# Patient Record
Sex: Male | Born: 1965 | Race: White | Hispanic: No | Marital: Married | Smoking: Never smoker
Health system: Southern US, Community
[De-identification: ages and names within clinical notes are randomized; demographics above are authoritative.]

## PROBLEM LIST (undated history)

## (undated) DIAGNOSIS — N200 Calculus of kidney: Secondary | ICD-10-CM

## (undated) DIAGNOSIS — N400 Enlarged prostate without lower urinary tract symptoms: Secondary | ICD-10-CM

## (undated) DIAGNOSIS — N529 Male erectile dysfunction, unspecified: Secondary | ICD-10-CM

## (undated) DIAGNOSIS — K219 Gastro-esophageal reflux disease without esophagitis: Secondary | ICD-10-CM

## (undated) HISTORY — PX: TONSILLECTOMY: SUR1361

## (undated) HISTORY — DX: Benign prostatic hyperplasia without lower urinary tract symptoms: N40.0

## (undated) HISTORY — PX: CHOLECYSTECTOMY: SHX55

## (undated) HISTORY — DX: Calculus of kidney: N20.0

## (undated) HISTORY — DX: Male erectile dysfunction, unspecified: N52.9

---

## 2008-11-28 ENCOUNTER — Emergency Department: Payer: Self-pay | Admitting: Emergency Medicine

## 2011-01-02 IMAGING — CT CT ABD-PELV W/O CM
1 of 2 series · 16 of 32 positions shown, 20 images · non-contrast
Comparison: none

REASON FOR EXAM: (1) LLQ pain; (2) LLQ pain, stone protocol
COMMENTS:

[Series 2: stone · axial · 0.69mm/px · z∈[-942,-484]mm · 16 of 167 slices shown, 20 images]
[im 7/167  soft-tissue]
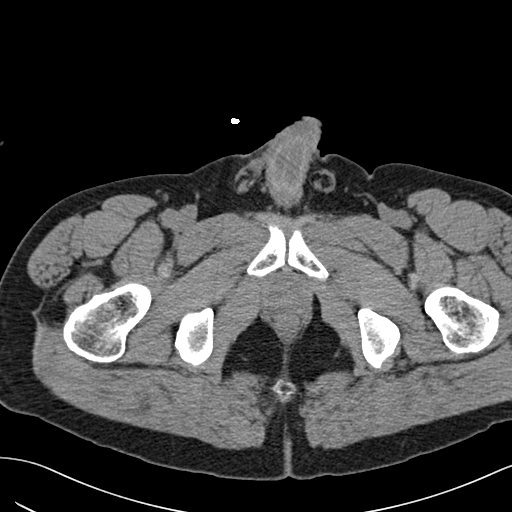
[im 7/167  bone]
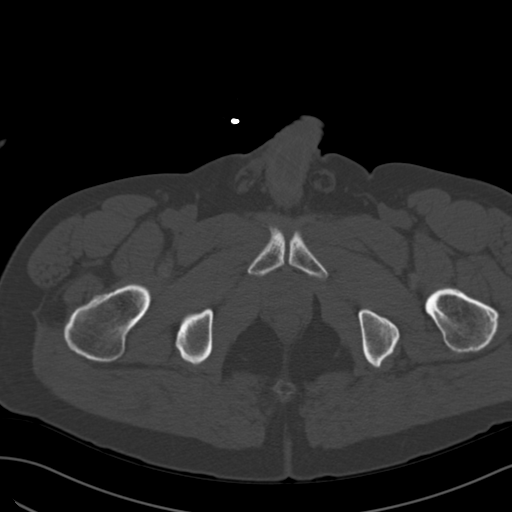
[im 19/167  soft-tissue]
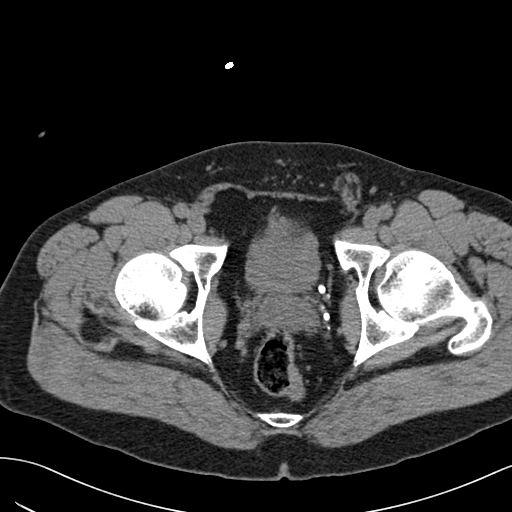
[im 31/167  soft-tissue]
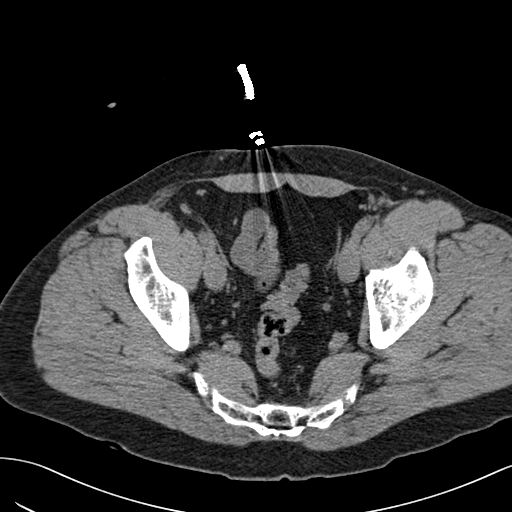
[im 44/167  soft-tissue]
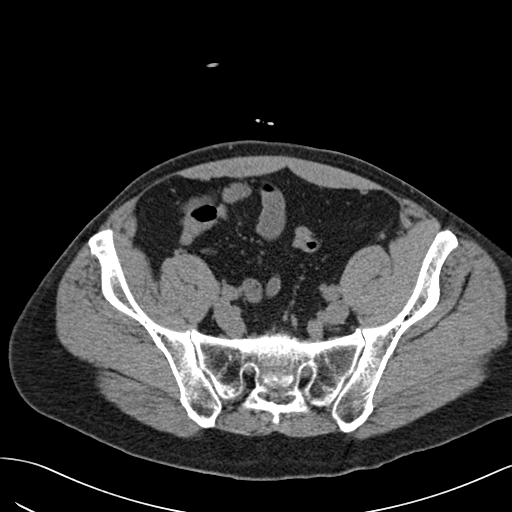
[im 56/167  soft-tissue]
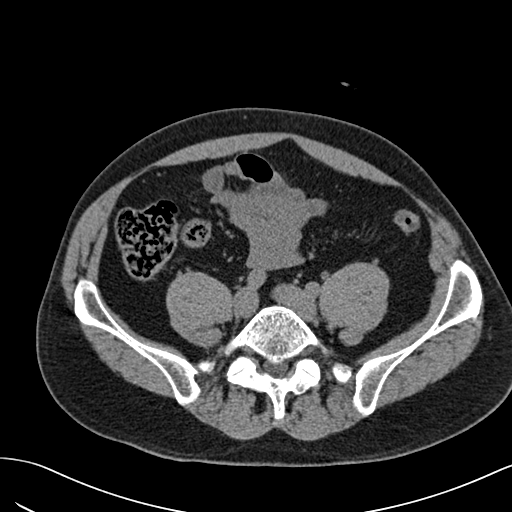
[im 68/167  soft-tissue]
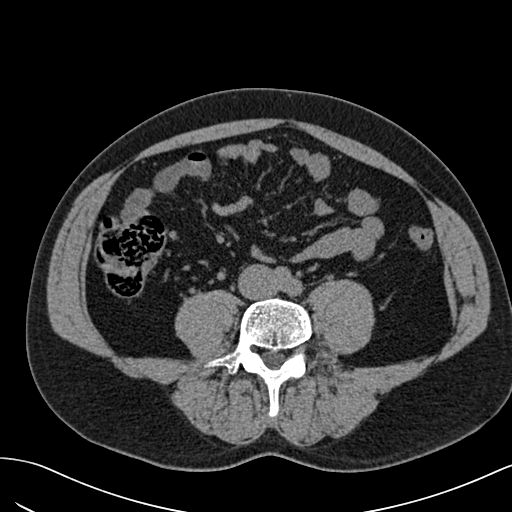
[im 80/167  soft-tissue]
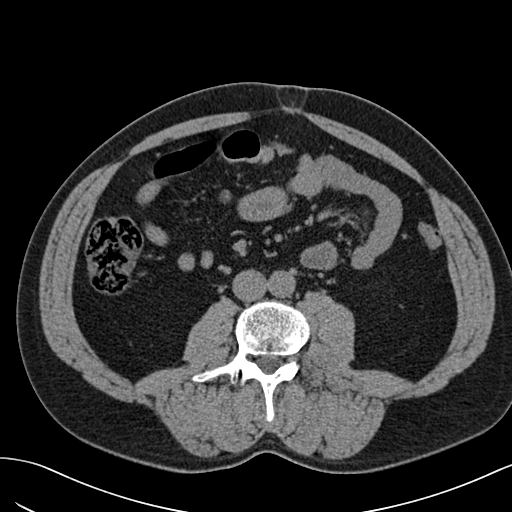
[im 87/167  soft-tissue]
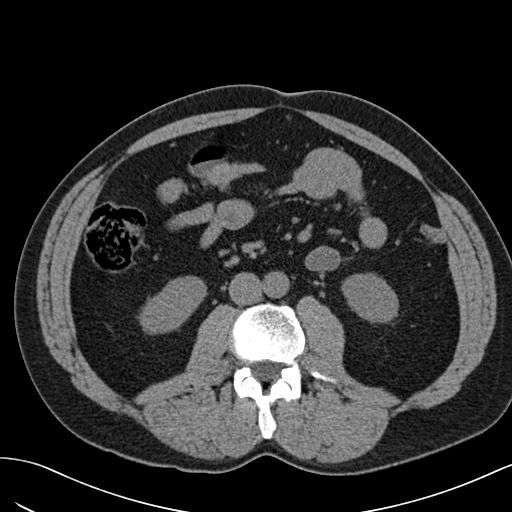
[im 99/167  soft-tissue]
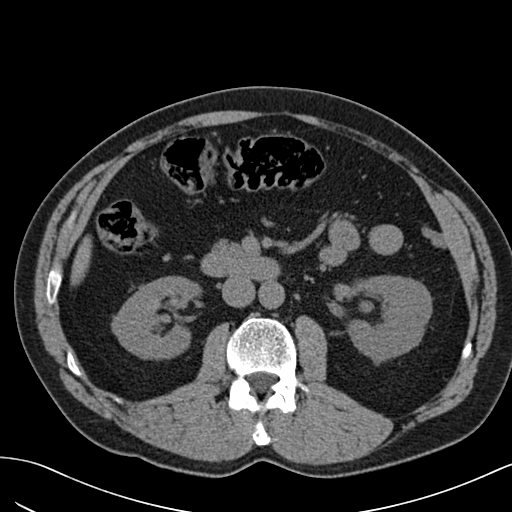
[im 99/167  bone]
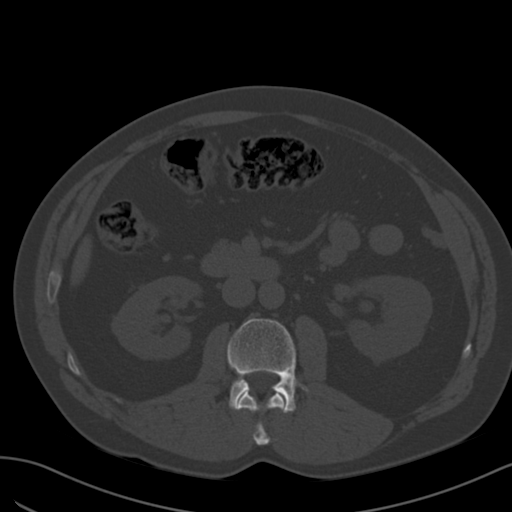
[im 111/167  soft-tissue]
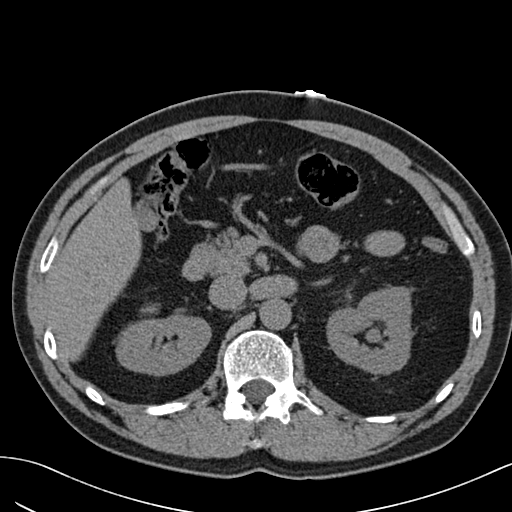
[im 123/167  soft-tissue]
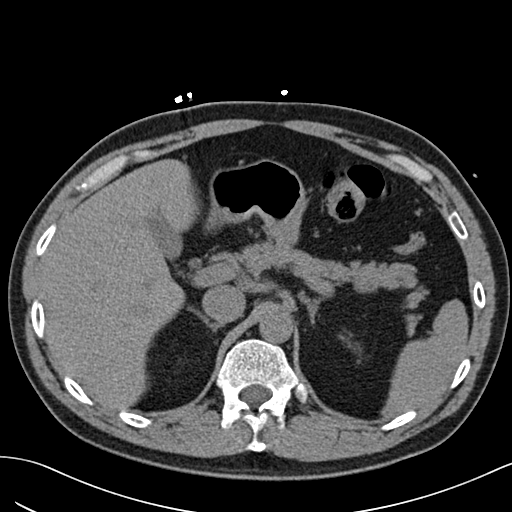
[im 136/167  soft-tissue]
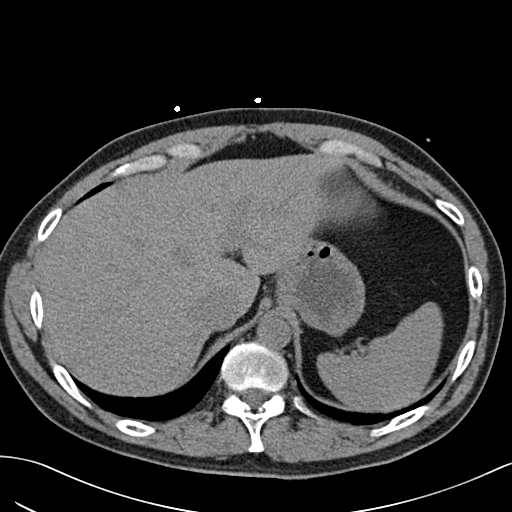
[im 142/167  lung]
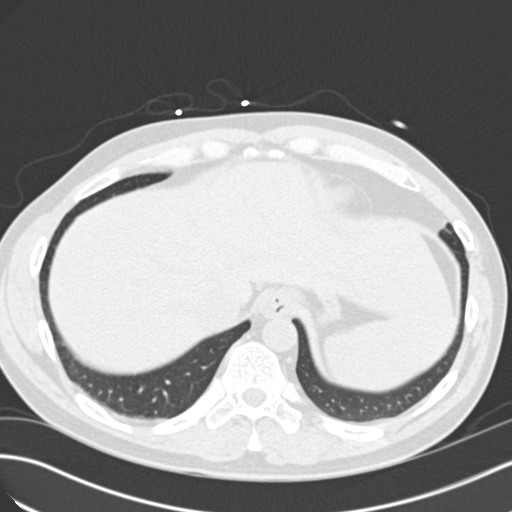
[im 148/167  soft-tissue]
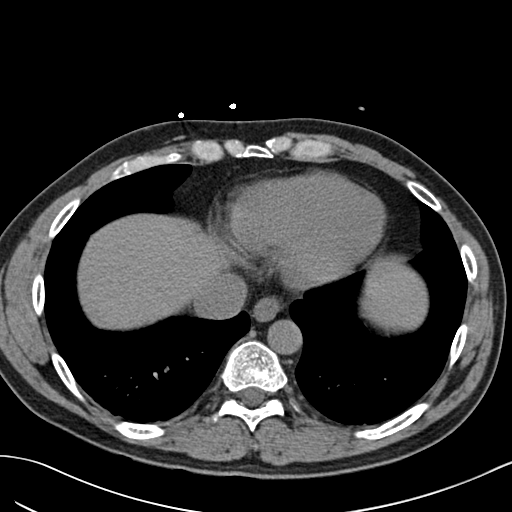
[im 148/167  lung]
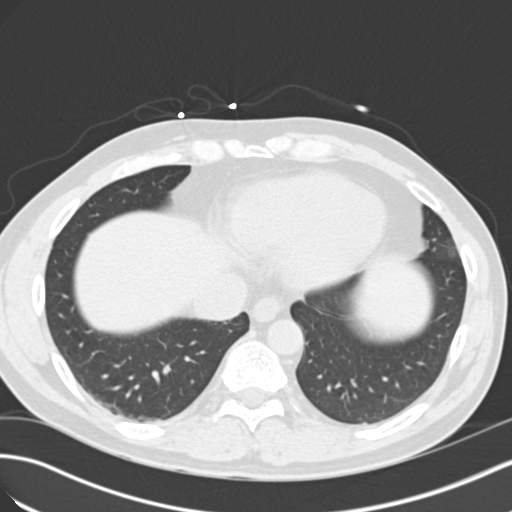
[im 154/167  lung]
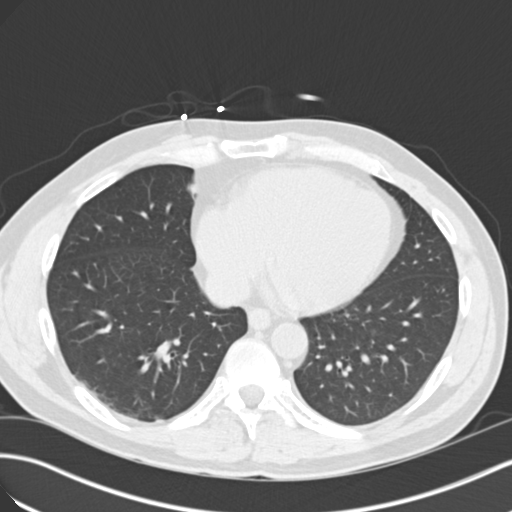
[im 160/167  soft-tissue]
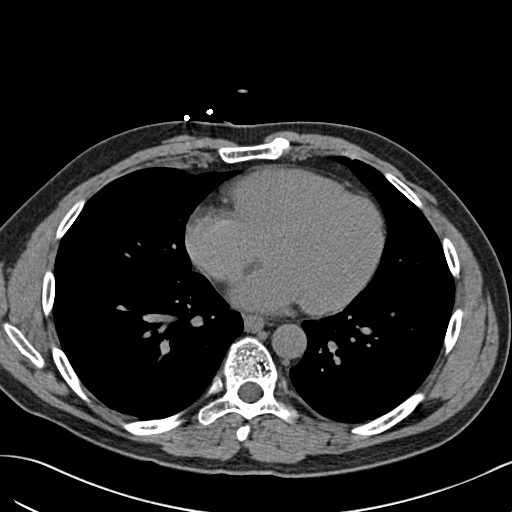
[im 160/167  lung]
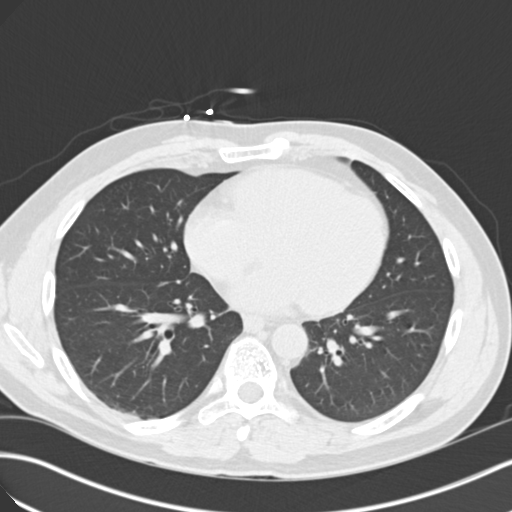

[16 of 32 positions shown; findings below may reference images not displayed]

PROCEDURE:     CT  - CT ABDOMEN AND PELVIS W[DATE]  [DATE]

RESULT:     Helical non-contrast 3 mm sections were obtained from the lung
bases through the pubic symphysis.

Evaluation of the liver, spleen, adrenals and pancreas are unremarkable
within the limitations of a non-contrast CT.  There is mild pelviectasis
involving the LEFT kidney. There is no evidence of hydronephrosis,
nephrolithiasis, nor masses. There is no evidence of hydronephrosis,
hydroureter nor ureterolithiasis involving the RIGHT kidney. Mild prominence
of the LEFT ureter is appreciated. Within the distal LEFT ureter a 4.4 mm
calculus is identified. The calculus projects approximately 2 cm proximal to
the ureterovesical junction.
IMPRESSION: Dr. Nidia Amparo of the Emergency Department was informed of these findings at the
time of the initial interpretation.

## 2017-11-13 ENCOUNTER — Encounter (INDEPENDENT_AMBULATORY_CARE_PROVIDER_SITE_OTHER): Payer: Self-pay

## 2017-11-21 ENCOUNTER — Encounter: Payer: Self-pay | Admitting: Family Medicine

## 2017-11-21 ENCOUNTER — Ambulatory Visit (INDEPENDENT_AMBULATORY_CARE_PROVIDER_SITE_OTHER): Payer: 59 | Admitting: Family Medicine

## 2017-11-21 VITALS — BP 110/80 | HR 69 | Temp 98.3°F | Resp 16 | Ht 69.0 in | Wt 183.0 lb

## 2017-11-21 DIAGNOSIS — Z7689 Persons encountering health services in other specified circumstances: Secondary | ICD-10-CM | POA: Diagnosis not present

## 2017-11-21 DIAGNOSIS — R1013 Epigastric pain: Secondary | ICD-10-CM | POA: Diagnosis not present

## 2017-11-21 DIAGNOSIS — R5383 Other fatigue: Secondary | ICD-10-CM | POA: Diagnosis not present

## 2017-11-21 DIAGNOSIS — R42 Dizziness and giddiness: Secondary | ICD-10-CM | POA: Diagnosis not present

## 2017-11-21 DIAGNOSIS — W57XXXA Bitten or stung by nonvenomous insect and other nonvenomous arthropods, initial encounter: Secondary | ICD-10-CM

## 2017-11-21 DIAGNOSIS — K219 Gastro-esophageal reflux disease without esophagitis: Secondary | ICD-10-CM | POA: Insufficient documentation

## 2017-11-21 DIAGNOSIS — S40262A Insect bite (nonvenomous) of left shoulder, initial encounter: Secondary | ICD-10-CM

## 2017-11-21 MED ORDER — OMEPRAZOLE 40 MG PO CPDR: 40.0000 mg | DELAYED_RELEASE_CAPSULE | Freq: Every day | ORAL | 1 refills | Status: DC

## 2017-11-21 MED ORDER — SUCRALFATE 1 G PO TABS: 1.0000 g | ORAL_TABLET | Freq: Three times a day (TID) | ORAL | 0 refills | Status: DC

## 2017-11-21 NOTE — Patient Instructions (Addendum)
Thank you for coming to the office today.  Will check blood tests today - tick, among other labs including thyroid and chemistry and blood count  I am not sure the exact cause of your upper abdominal pain and overall symptoms, however I am concerned that one significant possibility could be uncontrolled Acid Reflux (GERD) and may have developed an Ulcer (Peptic Ulcer of stomach).  After you have completed your H Pylori Breath Test today, we can start with the prescribed medicines, and we will notify you of your result and if we have to change treatment.  IF BREATH TEST = NEGATIVE (or waiting for results): Starting tomorrow before breakfast or tonight before dinner take Omeprazole  daily. Prefer to take this med about 30 min before breakfast or 1st meal of day for 4 weeks, don't stop taking unless we discuss first. Probably will need for about 8 weeks total if this ends up being an Ulcer.  Take other prescribed medicine Carafate (Sucralfate) as needed up to 4 times daily (3 meals and bedtime)  to coat stomach lining to ease symptoms, if it helps reduce symptoms then it is more likely to be due to acid and/or ulcer.  IF BREATH TEST = POSITIVE (we notified you of this result): - CHANGE Omeprazole dose from  daily to  (one pill) TWICE daily, about 30 min before breakfast and dinner - FOR TWO WEEKS, then resume DAILY treatment - Also we will send in TWO antibiotics to take IN ADDITION: - PCN Allergy Metronidazole  TID / Clarithromycin  TWICE daily - both for 2 weeks  DIET RECOMMENDATIONS - Avoid spicy, greasy, fried foods, also things like caffeine, dark chocolate, peppermint can worsen - Avoid large meals and late night snacks, also do not go more than 4-5 hours without a snack or meal (not eating will worsen reflux symptoms due to stomach acid) - You may also elevate the head of your bed at night to sleep at very slight incline to help reduce symptoms  If the problem  improves but keeps coming back, we can discuss higher dose or longer course at next visit.  If symptoms are worsening or persistent despite treatment or develop any different severe esophagus or abdominal pain, unable to swallow solids or liquids, nausea, vomiting especially blood in vomit, fever/chills, or unintentional weight loss / no appetite, please follow-up sooner in office or seek more immediate medical attention at hospital Emergency Department.  Regarding other medicines:  - STOP taking Ibuprofen, Advil, Motrin, Goody's / BC powder - DO NOT take without discussing with your doctor. These medicines can put you at high risk for future bleeding.  If need pain medicine, may take Tylenol Extra Strength (Acetaminophen)  tabs - take 1 to 2 tabs per dose (max ) every 6-8 hours for pain (take regularly, don't skip a dose for next 7 days), max 24 hour daily dose is 6 tablets or . In the future you can repeat the same everyday Tylenol course for 1-2 weeks at a time.  If you have any significant chest pain that does not go away within 30 minutes, is accompanied by nausea, sweating, shortness of breath, or made worse by activity, this may be evidence of a heart attack, especially if symptoms worsening instead of improving, please call 911 or go directly to the emergency room immediately for evaluation.   Please schedule a Follow-up Appointment to: Return in about 1 month (around 12/19/2017) for Follow-up GERD meds / Lightheaded.  If you have any  other questions or concerns, please feel free to call the office or send a message through MyChart. You may also schedule an earlier appointment if necessary.  Additionally, you may be receiving a survey about your experience at our office within a few days to 1 week by e-mail or mail. We value your feedback.  [name], DO [facility], [place]

## 2017-11-21 NOTE — Progress Notes (Signed)
Subjective:    Patient ID: Dennis Keller, male    DOB: 1965/12/27, 52 y.o.   MRN: [id number]  Dennis Keller is a 52 y.o. male presenting on 11/21/2017 for Establish Care (B/P 122/83 ranges started exercise and experienced some chest discomfort, light HA, stress) and Nasal Congestion  Previously followed by [facility] but they have since retired, last visit was >5 years ago and he would be new patient could not get back in until July 2019, he decided to go to [facility] and then came here for establish new PCP.  HPI   Episodic Lightheadedness, Sweating episode / Episodic Chest Discomfort Reports about 6 weeks ago he started to resume regular exercise and went back to the gym, he started light exercise and cardio goal to get weight loss down from 190 lbs down to 175 to 180s. 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 He takes daily Staminol supplement with saw palmetto for erectile dysfunction and it helps his urination as well, no prior dx BPH. - He is asking about possible blood work for tick bite - History of prior vertigo episode in past with room spinning, treated sinuses w/ antibiotic with relief at that time Currently not having vertigo only  lightheadedness. He states this feels "different" - Currently asymptomatic. He has some episodic symptoms mentioned above with lightheadedness, may only last 15-30 min fam history of mother with thyroid problems Not taking NSAIDs Recently tried Cetirizine - D allergy Admits sinus pressure and congestion Admits some muscle tension in neck and upper back Admits some general lightheadedness without symptoms of near syncope or syncope Denies fevers chills sweats cough, weakness, numbness tingling, headaches, nausea vomiting, diarrhea, abdominal pain, vision changes  History of Kidney Stone Reports one episode in past. Used to drink sweet tea more, he passed stone. Denies any dysuria or flank pain or hematuria  GERD History of recurrent GERD and acid reflux heart burn. Sometimes worse at night. He uses balsamic vinegar and honey with relief, it does "burn" some but helps usually for months will use for few nights only and can worsen with triggers. - Recently also had an unusual "chest burning" few nights ago. - In past he tried Pepcid with mixed results years ago. Otherwise, not taking any regular OTC medications Admits episodic epigastric pain  Lives in [place] locally In [employer] for 6 years, and he was activated went to [place] for 6 months He used to be active outdoors, worked on Product/process development scientist and active. He used to do more activity. He now works as Education officer, environmental, and he is more sedentary. He goes to [place] often for work.  Health Maintenance:  Colon CA Screening: Never had colonoscopy or colon cancer screening. Currently asymptomatic. No known family  history of colon CA. Due for screening test age >46 will return to discuss future physical   Depression screen [facility] 2/9 11/21/2017  Decreased Interest 0  Down, Depressed, Hopeless 0  PHQ - 2 Score 0    Past Medical History:  Diagnosis Date  . Kidney stone    Past Surgical History:  Procedure Laterality Date  . TONSILLECTOMY      Social History   Socioeconomic History  . Marital status: Married    Spouse name: Not on file  . Number of children: Not on file  . Years of education: College  . Highest education level: Not on file  Occupational History  . Occupation: Education officer, environmental  Social Needs  . Financial resource strain: Not on file  . Food insecurity:    Worry: Not on file    Inability: Not on file  . Transportation needs:    Medical: Not on file    Non-medical: Not on file  Tobacco Use  . Smoking status: Never Smoker  . Smokeless tobacco: Former Neurosurgeon    Types: Chew  . Tobacco comment: Smokeless tobacco for 10 years, quit 2007  Substance and Sexual Activity  . Alcohol use: Never    Frequency: Never  . Drug use: Never  . Sexual activity: Not on file  Lifestyle  . Physical activity:    Days per week: Not on file    Minutes per session: Not on file  . Stress: Not on file  Relationships  . Social connections:    Talks on phone: Not on file    Gets together: Not on file    Attends religious service: Not on file    Active member of club or organization: Not on file    Attends meetings of clubs or organizations: Not on file    Relationship status: Not on file  . Intimate partner violence:    Fear of current or ex partner: Not on file    Emotionally abused: Not on file    Physically abused: Not on file    Forced sexual activity: Not on file  Other Topics Concern  . Not on file  Social History Narrative  . Not on file   Family History  Problem Relation Age of Onset  . Diabetes Mother   . Thyroid disease Mother   . Cancer Father 54       throat, smoker  . Stroke Father   . Prostate cancer Neg Hx   . Colon cancer Neg Hx    No current outpatient medications on file prior to visit.   No current facility-administered medications on file prior to visit.     Review of Systems Per HPI unless specifically indicated above     Objective:    BP 110/80   Pulse 69   Temp 98.3 F (36.8 C) (Oral)    Resp 16   Ht  (1.753 m)   Wt 183 lb (83 kg)   BMI 27.02 kg/m   Wt Readings from Last 3 Encounters:  11/21/17 183 lb (83 kg)    Physical Exam  Constitutional: He is oriented to person, place, and time. He appears well-developed and well-nourished. No distress.  Well-appearing, comfortable, cooperative  HENT:  Head: Normocephalic and atraumatic.  Mouth/Throat: Oropharynx is clear and moist.  Frontal / maxillary sinuses non-tender. Nares patent without purulence or edema. Bilateral TMs clear without erythema, effusion or bulging. Oropharynx clear without erythema, exudates, edema or asymmetry.  Eyes: Pupils are equal, round, and reactive  to light. Conjunctivae and EOM are normal. Right eye exhibits no discharge. Left eye exhibits no discharge.  Neck: Normal range of motion. Neck supple. No thyromegaly present.  No carotid bruits  Cardiovascular: Normal rate, regular rhythm, normal heart sounds and intact distal pulses.  No murmur heard. Pulmonary/Chest: Effort normal and breath sounds normal. No respiratory distress. He has no wheezes. He has no rales.  Abdominal: Soft. Bowel sounds are normal. He exhibits no distension and no mass. There is no tenderness.  No reproducible pain on abdomen exam Negative RUQ Murphys Negative lower abdomen RLQ  Musculoskeletal: Normal range of motion. He exhibits no edema or tenderness.  Upper / Lower Extremities: - Normal muscle tone, strength bilateral upper extremities 5/5, lower extremities 5/5  Lymphadenopathy:    He has no cervical adenopathy.    He has no axillary adenopathy.       Right axillary: No pectoral and no lateral adenopathy present.       Left axillary: No pectoral and no lateral adenopathy present.      Right: No supraclavicular adenopathy present.       Left: No supraclavicular adenopathy present.  Neurological: He is alert and oriented to person, place, and time.  Distal sensation intact to light touch all extremities   Skin: Skin is warm and dry. No rash noted. He is not diaphoretic. No erythema.  Anterior R neck/clavicular region - 2 areas of distinct 1 cm approx localized erythematous papular spots, non tender, no extending erythema, no drainage or induration, no ulceration. No erythema migrans.  Psychiatric: He has a normal mood and affect. His behavior is normal.  Well groomed, good eye contact, normal speech and thoughts  Nursing note and vitals reviewed.  No results found for this or any previous visit.    Assessment & Plan:   Problem List Items Addressed This Visit    Gastroesophageal reflux disease without esophagitis   Relevant Medications   omeprazole (PRILOSEC) 40 MG capsule   sucralfate (CARAFATE) 1 g tablet   Other Relevant Orders   CBC with Differential/Platelet   COMPLETE METABOLIC PANEL WITH GFR   H. pylori breath test    Other Visit Diagnoses    Episodic lightheadedness    -  Primary   Relevant Orders   TSH   T4, free   Encounter to establish care with new doctor       Epigastric abdominal pain       Relevant Medications   omeprazole (PRILOSEC) 40 MG capsule   sucralfate (CARAFATE) 1 g tablet   Other Relevant Orders   CBC with Differential/Platelet   COMPLETE METABOLIC PANEL WITH GFR   H. pylori breath test   Tick bite of left shoulder, initial encounter       Relevant Orders   B. burgdorfi antibodies   Fatigue, unspecified type       Relevant Orders   TSH   T4, free      Previously healthy patient now with episodic and worsening symptoms concern for multiple possible etiology, possibly multiple problems, it seems biggest concern was for episodes of chest discomfort and pain assoc with sweating episode and some lightheadedness. No known cardiac history, he has been recently evaluated at [facility] (reviewed record, to be scanned) with A1c 4.3 and EKG unremarkable and CXR also unremarkable. - Given his known history of chronic GERD with symptoms - some of his  history seems to match possibly uncontrolled GERD now given epigastric pain and description. - Less  likely hypoglycemia since checked at time of initial episode by report 125 and has normal A1c from urgent care (low range but no history of elevated sugar) - Denies exertional dyspnea or chest tightness or other concerning history - Denies vertigo but had prior history of this - His sinuses do not seem to be acutely infected at this time - but ultimately if worse and not improving could consider empiric antibiotic for sinusitis  No other significant GI red flags (no unintentional wt loss, night-sweats, refractory abdominal pain n/v, hematemesis or melena).  Plan: 1. Start prolonged PPI trial with Omeprazole  daily for 4-8 weeks TBD 2. Today check H pylori breath test (given in office), confirmed off PPI >2 weeks now - if positive, will increase PPI to BID dosing, add on triple therapy antibiotics with PCN allergy metronidazole 500 TID and clarithro 500 BID for 2 weeks - will notify of result, for now start therapy PPI 3. Start carafate 1g TID WC + QHS PRN for symptom relief 4. Strict return criteria given for worsening symptoms 5. Follow-up within 1-2 weeks, if no improvement or worsening consider future GI  Additional testing - labs drawn today fasting with CMET, CBC, B burgdorfi antibodies for possible lyme exposure by his request but clinically seems less likely, added Thyroid function given generalized fatigue and constellation of symptoms also fam history  Request outside medical records for further review from prior PCP  Meds ordered this encounter  Medications  . omeprazole (PRILOSEC) 40 MG capsule    Sig: Take 1 capsule (40 mg total) by mouth daily.    Dispense:  30 capsule    Refill:  1  . sucralfate (CARAFATE) 1 g tablet    Sig: Take 1 tablet (1 g total) by mouth 4 (four) times daily -  with meals and at bedtime.    Dispense:  40 tablet    Refill:  0    Follow up  plan: Return in about 1 month (around 12/19/2017) for Follow-up GERD meds / Lightheaded.  [name], DO [facility] 11/21/2017, 5:42 PM

## 2017-11-22 LAB — UNLABELED
Specimen Type: 2
TEST ORDERED ON REQ: 14837

## 2017-11-27 LAB — UNLABELED REQ TIQ

## 2017-11-27 LAB — CBC WITH DIFFERENTIAL/PLATELET
BASOS PCT: 0.7 %
Basophils Absolute: 40 cells/uL (ref 0–200)
Eosinophils Absolute: 154 cells/uL (ref 15–500)
Eosinophils Relative: 2.7 %
HCT: 45.4 % (ref 38.5–50.0)
Hemoglobin: 15.1 g/dL (ref 13.2–17.1)
Lymphs Abs: 1915 cells/uL (ref 850–3900)
MCH: 28.7 pg (ref 27.0–33.0)
MCHC: 33.3 g/dL (ref 32.0–36.0)
MCV: 86.3 fL (ref 80.0–100.0)
MONOS PCT: 9.7 %
MPV: 9.9 fL (ref 7.5–12.5)
Neutro Abs: 3038 cells/uL (ref 1500–7800)
Neutrophils Relative %: 53.3 %
PLATELETS: 273 10*3/uL (ref 140–400)
RBC: 5.26 10*6/uL (ref 4.20–5.80)
RDW: 12.2 % (ref 11.0–15.0)
TOTAL LYMPHOCYTE: 33.6 %
WBC: 5.7 10*3/uL (ref 3.8–10.8)
WBCMIX: 553 {cells}/uL (ref 200–950)

## 2017-11-27 LAB — COMPLETE METABOLIC PANEL WITH GFR
AG RATIO: 1.6 (calc) (ref 1.0–2.5)
ALT: 37 U/L (ref 9–46)
AST: 23 U/L (ref 10–35)
Albumin: 4.4 g/dL (ref 3.6–5.1)
Alkaline phosphatase (APISO): 52 U/L (ref 40–115)
BILIRUBIN TOTAL: 0.6 mg/dL (ref 0.2–1.2)
BUN: 13 mg/dL (ref 7–25)
CO2: 28 mmol/L (ref 20–32)
Calcium: 9.4 mg/dL (ref 8.6–10.3)
Chloride: 105 mmol/L (ref 98–110)
Creat: 0.8 mg/dL (ref 0.70–1.33)
GFR, EST AFRICAN AMERICAN: 120 mL/min/{1.73_m2} (ref >=60)
GFR, Est Non African American: 103 mL/min/{1.73_m2} (ref >=60)
Globulin: 2.7 g/dL (calc) (ref 1.9–3.7)
Glucose, Bld: 86 mg/dL (ref 65–99)
POTASSIUM: 4.5 mmol/L (ref 3.5–5.3)
Sodium: 140 mmol/L (ref 135–146)
Total Protein: 7.1 g/dL (ref 6.1–8.1)

## 2017-11-27 LAB — TSH: TSH: 1.19 m[IU]/L (ref 0.40–4.50)

## 2017-11-27 LAB — H. PYLORI BREATH TEST: H. pylori Breath Test: NOT DETECTED

## 2017-11-27 LAB — T4, FREE: FREE T4: 1.3 ng/dL (ref 0.8–1.8)

## 2017-11-27 LAB — B. BURGDORFI ANTIBODIES: B burgdorferi Ab IgG+IgM: <0.9 index

## 2017-12-31 ENCOUNTER — Encounter: Payer: Self-pay | Admitting: Family Medicine

## 2017-12-31 ENCOUNTER — Other Ambulatory Visit: Payer: Self-pay | Admitting: Family Medicine

## 2017-12-31 ENCOUNTER — Ambulatory Visit (INDEPENDENT_AMBULATORY_CARE_PROVIDER_SITE_OTHER): Payer: 59 | Admitting: Family Medicine

## 2017-12-31 VITALS — BP 114/80 | HR 68 | Temp 98.2°F | Resp 16 | Ht 69.0 in | Wt 182.0 lb

## 2017-12-31 DIAGNOSIS — J3089 Other allergic rhinitis: Secondary | ICD-10-CM

## 2017-12-31 DIAGNOSIS — Z131 Encounter for screening for diabetes mellitus: Secondary | ICD-10-CM

## 2017-12-31 DIAGNOSIS — Z125 Encounter for screening for malignant neoplasm of prostate: Secondary | ICD-10-CM

## 2017-12-31 DIAGNOSIS — Z0000 Encounter for general adult medical examination without abnormal findings: Secondary | ICD-10-CM

## 2017-12-31 DIAGNOSIS — K219 Gastro-esophageal reflux disease without esophagitis: Secondary | ICD-10-CM

## 2017-12-31 DIAGNOSIS — J309 Allergic rhinitis, unspecified: Secondary | ICD-10-CM | POA: Insufficient documentation

## 2017-12-31 DIAGNOSIS — Z13220 Encounter for screening for lipoid disorders: Secondary | ICD-10-CM

## 2017-12-31 MED ORDER — FLUTICASONE PROPIONATE 50 MCG/ACT NA SUSP: 2.0000 | Freq: Every day | NASAL | 3 refills | Status: DC

## 2017-12-31 MED ORDER — SUCRALFATE 1 G PO TABS: 1.0000 g | ORAL_TABLET | Freq: Three times a day (TID) | ORAL | 0 refills | Status: DC

## 2017-12-31 NOTE — Patient Instructions (Addendum)
Thank you for coming to the office today.  For lightheadedness and sinus symptoms - let's treat it as an allergy at this time, as discussed  Start nasal steroid Flonase 2 sprays in each nostril daily for 4-6 weeks, may repeat course seasonally or as needed  If still not resolved May also add on OTC Loratadine (Claritin) or Cetirizine (Zyrtec) 10mg  daily take your pick one of these daily for several weeks may help as well.  Taper down on Omeprazole 40mg  after this last week - use your last 2 weeks of pills to take every OTHER day for 1 week then every 3rd day and so on for last 1-2 weeks if you can stretch it out, to avoid REBOUND heartburn symptoms.  Refilled Carafate take ONLY AS NEEDED with meals or bedtime. Or can use antacid OTC.  If still need stomach acid medicine or have recurrence by 3-4 weeks from now - call office or send message, we can switch to LOWER dose Omeprazole 20mg  this is also OTC - and try this daily for 2-4 more weeks then taper off again.   DUE for FASTING BLOOD WORK (no food or drink after midnight before the lab appointment, only water or coffee without cream/sugar on the morning of)  SCHEDULE "Lab Only" visit in the morning at the clinic for lab draw in 3 MONTHS   - Make sure Lab Only appointment is at about 1 week before your next appointment, so that results will be available  For Lab Results, once available within 2-3 days of blood draw, you can can log in to MyChart online to view your results and a brief explanation. Also, we can discuss results at next follow-up visit.   Please schedule a Follow-up Appointment to: Return in about 3 months (around 04/02/2018) for Annual Physical.  If you have any other questions or concerns, please feel free to call the office or send a message through MyChart. You may also schedule an earlier appointment if necessary.  Additionally, you may be receiving a survey about your experience at our office within a few days to 1 week  by e-mail or mail. We value your feedback.  Saralyn PilarAlexander Quinley Nesler, DO [facility], [place]

## 2017-12-31 NOTE — Assessment & Plan Note (Signed)
Significantly improved on PPI acute on chronic GERD with recent flare Seems chest pain was related to GERD as this has resolved - No GI red flag symptoms. Exam is unremarkable with benign abdomen without pain today - Breath test negative for H Pylori last visit 10/2017  Plan: 1. Continue current treatment with Omeprazole 40mg  daily before breakfast for 1 more week, then use remaining 2 weeks of rx to taper off gradually as instructed - If has recurrence or rebound and needs PPI again he may contact office for Omeprazole 20mg  daily before breakfast for another 2-4 week trial, also can try OTC version - Future may need PPI longer term if indicated or just PRN episodes 2. Diet modifications reduce GERD, avoid NSAIDs 3. Also given rx refill Carafate PRN 4. Follow-up as needed sooner otherwise 3 months for annual

## 2017-12-31 NOTE — Assessment & Plan Note (Signed)
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 May have some pressure changes from sinuses and could lead to lightheaded or ear pressure possibly episodes of vertigo but not fully supported by history  Plan Start nasal steroid Flonase 2 sprays in each nostril daily for 4-6 weeks, may repeat course seasonally or as needed May add regular oral anti-histamine as well Follow-up if not improving or new symptoms return criteria, consider empiric antibiotic for sinus and also consider future ENT consult

## 2017-12-31 NOTE — Progress Notes (Signed)
Subjective:    Patient ID: Dennis Keller, male    DOB: 02/06/1966, 52 y.o.   MRN: [id number]  Dennis Keller is a 52 y.o. male presenting on 12/31/2017 for Gastroesophageal Reflux   HPI   Follow-up Episodic Lightheadedness / Sinusitis - Last visit with me 11/21/17, for initial visit as new patient for same problem, treated with lab test work-up and treated GERD see below, see prior notes for background information. - Interval update with lab results reviewed and all unremarkable / normal results for CMET, CBC, TSH, Lyme antibody no obvious explanation for his lightheadedness episodic symptoms - Today patient reports still has persistent problem, but does not seem worse at all. But still not resolved. Episodes of these flares do not occur every day. - never tried flonase or nasal steroid, has taken some allergy pills in past, not regularly - Admits still persistent episodes of sinus flare up with pressure sinuses and pressure behind ears, he needs to hold nose and blow to create pressure to pop ears. He does have some associated sinus pain deeper as well - Admits some posterior throat drainage and irritation - Denies fevers chills, sweats, sinus pain, purulent drainage, vision changes, dizziness vertigo, headache  GERD - Last visit with me 11/21/17, for initial visit for same problem with new dx GERD, treated with checked H Pylori breath test negative then new start higher dose PPI omeprazole 40mg  and carafate, see prior notes for background information. - Interval updates / and report today with significant improvement and now mostly resolution of symptoms of heartburn GERD - He is still taking Omeprazole 40mg  daily before breakfast, tolerating it well, he has about 2-3 more weeks of medicine left, and asking how long to keep taking it - He has finished Carafate, accidentally did not take as PRN instead he took daily TID with meals for 1-2 weeks it was helpful, request refill for PRN use -  Denies any chest pain, burning or heartburn, nausea vomiting, abdominal pain, dark stool or rectal bleeding   Additional history - Tick about 2 weeks ago attached for < 24 hours on Right thigh, removed after further problem, and did not have extending rash or erythema migrans or fever. - Left lower leg - [facility] recently - abnormal mole removed, pathology resulted benign   Depression screen [facility] 2/9 12/31/2017 11/21/2017  Decreased Interest 0 0  Down, Depressed, Hopeless 0 0  PHQ - 2 Score 0 0    Social History   Tobacco Use  . Smoking status: Never Smoker  . Smokeless tobacco: Former Neurosurgeon    Types: Chew  . Tobacco comment: Smokeless tobacco for 10 years, quit 2007  Substance Use Topics  . Alcohol use: Never    Frequency: Never  . Drug use: Never    Review of Systems Per HPI unless specifically indicated above     Objective:    BP 114/80   Pulse 68   Temp 98.2 F (36.8 C) (Oral)   Resp 16   Ht 5\' 9"  (1.753 m)   Wt 182 lb (82.6 kg)   BMI 26.88 kg/m   Wt Readings from Last 3 Encounters:  12/31/17 182 lb (82.6 kg)  11/21/17 183 lb (83 kg)    Physical Exam  Constitutional: He is oriented to person, place, and time. He appears well-developed and well-nourished. No distress.  Well-appearing, comfortable, cooperative  HENT:  Head: Normocephalic and atraumatic.  Mouth/Throat: Oropharynx is clear and moist.  Frontal / maxillary sinuses  non-tender. Nares with some mild deeper turbinate edema without purulence. Bilateral TMs mostly normal, however Left compared to Righ thas very minimal clear effusion, without erythema or bulging. Oropharynx clear without erythema, exudates, edema or asymmetry.  Eyes: Conjunctivae are normal. Right eye exhibits no discharge. Left eye exhibits no discharge.  Neck: Neck supple.  Cardiovascular: Normal rate, regular rhythm, normal heart sounds and intact distal pulses.  No murmur heard. Pulmonary/Chest: Effort normal and breath  sounds normal. No respiratory distress. He has no wheezes. He has no rales.  Abdominal: Soft. Bowel sounds are normal. He exhibits no distension and no mass. There is no tenderness.  Musculoskeletal: Normal range of motion. He exhibits no edema or tenderness.  Neurological: He is alert and oriented to person, place, and time.  Skin: Skin is warm and dry. No rash noted. He is not diaphoretic. No erythema.  Psychiatric: He has a normal mood and affect. His behavior is normal.  Well groomed, good eye contact, normal speech and thoughts  Nursing note and vitals reviewed.  Results for orders placed or performed in visit on 11/21/17  CBC with Differential/Platelet  Result Value Ref Range   WBC 5.7 3.8 - 10.8 Thousand/uL   RBC 5.26 4.20 - 5.80 Million/uL   Hemoglobin 15.1 13.2 - 17.1 g/dL   HCT 47.845.4 29.538.5 - 62.150.0 %   MCV 86.3 80.0 - 100.0 fL   MCH 28.7 27.0 - 33.0 pg   MCHC 33.3 32.0 - 36.0 g/dL   RDW 30.812.2 65.711.0 - 84.615.0 %   Platelets 273 140 - 400 Thousand/uL   MPV 9.9 7.5 - 12.5 fL   Neutro Abs 3,038 1,500 - 7,800 cells/uL   Lymphs Abs 1,915 850 - 3,900 cells/uL   WBC mixed population 553 200 - 950 cells/uL   Eosinophils Absolute 154 15 - 500 cells/uL   Basophils Absolute 40 0 - 200 cells/uL   Neutrophils Relative % 53.3 %   Total Lymphocyte 33.6 %   Monocytes Relative 9.7 %   Eosinophils Relative 2.7 %   Basophils Relative 0.7 %  COMPLETE METABOLIC PANEL WITH GFR  Result Value Ref Range   Glucose, Bld 86 65 - 99 mg/dL   BUN 13 7 - 25 mg/dL   Creat 9.620.80 9.520.70 - 8.411.33 mg/dL   GFR, Est Non African American 103 > OR = 60 mL/min/1.7073m2   GFR, Est African American 120 > OR = 60 mL/min/1.8473m2   BUN/Creatinine Ratio NOT APPLICABLE 6 - 22 (calc)   Sodium 140 135 - 146 mmol/L   Potassium 4.5 3.5 - 5.3 mmol/L   Chloride 105 98 - 110 mmol/L   CO2 28 20 - 32 mmol/L   Calcium 9.4 8.6 - 10.3 mg/dL   Total Protein 7.1 6.1 - 8.1 g/dL   Albumin 4.4 3.6 - 5.1 g/dL   Globulin 2.7 1.9 - 3.7 g/dL  (calc)   AG Ratio 1.6 1.0 - 2.5 (calc)   Total Bilirubin 0.6 0.2 - 1.2 mg/dL   Alkaline phosphatase (APISO) 52 40 - 115 U/L   AST 23 10 - 35 U/L   ALT 37 9 - 46 U/L  B. burgdorfi antibodies  Result Value Ref Range   B burgdorferi Ab IgG+IgM <0.90 index  TSH  Result Value Ref Range   TSH 1.19 0.40 - 4.50 mIU/L  T4, free  Result Value Ref Range   Free T4 1.3 0.8 - 1.8 ng/dL  H. pylori breath test  Result Value Ref Range   H. pylori  Breath Test NOT DETECTED NOT DETECT  UNLABELED  Result Value Ref Range   Message     Specimen Type 2 UB    Name on Specimen NO NAME    Test Ordered On Req 14,837   UNLABELED REQ TIQ  Result Value Ref Range   .     Contact [employer] [employer]glory ZACHE    Test Affected NONE    Reason UNLABELLED       Assessment & Plan:   Problem List Items Addressed This Visit    Allergic rhinitis due to allergen    Clinically seems some of his sinus symptoms may be chronic allergic rhinosinusitis, no obvious evidence of bacterial sinus infection at this time. May have some pressure changes from sinuses and could lead to lightheaded or ear pressure possibly episodes of vertigo but not fully supported by history  Plan Start nasal steroid Flonase 2 sprays in each nostril daily for 4-6 weeks, may repeat course seasonally or as needed May add regular oral anti-histamine as well Follow-up if not improving or new symptoms return criteria, consider empiric antibiotic for sinus and also consider future ENT consult      Relevant Medications   fluticasone (FLONASE) 50 MCG/ACT nasal spray   Gastroesophageal reflux disease without esophagitis - Primary    Significantly improved on PPI acute on chronic GERD with recent flare Seems chest pain was related to GERD as this has resolved - No GI red flag symptoms. Exam is unremarkable with benign abdomen without pain today - Breath test negative for H Pylori last visit 10/2017  Plan: 1. Continue current treatment with Omeprazole 40mg  daily  before breakfast for 1 more week, then use remaining 2 weeks of rx to taper off gradually as instructed - If has recurrence or rebound and needs PPI again he may contact office for Omeprazole 20mg  daily before breakfast for another 2-4 week trial, also can try OTC version - Future may need PPI longer term if indicated or just PRN episodes 2. Diet modifications reduce GERD, avoid NSAIDs 3. Also given rx refill Carafate PRN 4. Follow-up as needed sooner otherwise 3 months for annual       Relevant Medications   sucralfate (CARAFATE) 1 g tablet      Meds ordered this encounter  Medications  . fluticasone (FLONASE) 50 MCG/ACT nasal spray    Sig: Place 2 sprays into both nostrils daily. Use for 4-6 weeks then stop and use seasonally or as needed.    Dispense:  16 g    Refill:  3  . sucralfate (CARAFATE) 1 g tablet    Sig: Take 1 tablet (1 g total) by mouth 4 (four) times daily -  with meals and at bedtime. As needed only if acid reflux / abdominal pain    Dispense:  30 tablet    Refill:  0     Follow up plan: Return in about 3 months (around 04/02/2018) for Annual Physical.  Future labs ordered for 04/10/18  [name], DO [facility] 12/31/2017, 1:22 PM

## 2018-01-15 ENCOUNTER — Other Ambulatory Visit: Payer: Self-pay | Admitting: Family Medicine

## 2018-01-15 DIAGNOSIS — R1013 Epigastric pain: Secondary | ICD-10-CM

## 2018-01-15 DIAGNOSIS — K219 Gastro-esophageal reflux disease without esophagitis: Secondary | ICD-10-CM

## 2018-04-10 ENCOUNTER — Ambulatory Visit (INDEPENDENT_AMBULATORY_CARE_PROVIDER_SITE_OTHER): Payer: 59

## 2018-04-10 ENCOUNTER — Other Ambulatory Visit: Payer: 59

## 2018-04-10 DIAGNOSIS — Z23 Encounter for immunization: Secondary | ICD-10-CM | POA: Diagnosis not present

## 2018-04-17 ENCOUNTER — Encounter: Payer: Self-pay | Admitting: Family Medicine

## 2018-04-17 ENCOUNTER — Ambulatory Visit (INDEPENDENT_AMBULATORY_CARE_PROVIDER_SITE_OTHER): Payer: 59 | Admitting: Family Medicine

## 2018-04-17 ENCOUNTER — Other Ambulatory Visit: Payer: Self-pay | Admitting: Family Medicine

## 2018-04-17 VITALS — BP 115/85 | HR 69 | Temp 98.1°F | Resp 16 | Ht 69.0 in | Wt 185.6 lb

## 2018-04-17 DIAGNOSIS — J0110 Acute frontal sinusitis, unspecified: Secondary | ICD-10-CM

## 2018-04-17 DIAGNOSIS — J3089 Other allergic rhinitis: Secondary | ICD-10-CM | POA: Diagnosis not present

## 2018-04-17 DIAGNOSIS — Z125 Encounter for screening for malignant neoplasm of prostate: Secondary | ICD-10-CM

## 2018-04-17 DIAGNOSIS — R1013 Epigastric pain: Secondary | ICD-10-CM

## 2018-04-17 DIAGNOSIS — Z23 Encounter for immunization: Secondary | ICD-10-CM | POA: Diagnosis not present

## 2018-04-17 DIAGNOSIS — K219 Gastro-esophageal reflux disease without esophagitis: Secondary | ICD-10-CM | POA: Diagnosis not present

## 2018-04-17 DIAGNOSIS — Z0000 Encounter for general adult medical examination without abnormal findings: Secondary | ICD-10-CM

## 2018-04-17 MED ORDER — SUCRALFATE 1 G PO TABS: 1.0000 g | ORAL_TABLET | Freq: Three times a day (TID) | ORAL | 0 refills | Status: DC

## 2018-04-17 MED ORDER — FLUTICASONE PROPIONATE 50 MCG/ACT NA SUSP: 2.0000 | Freq: Every day | NASAL | 3 refills | Status: AC

## 2018-04-17 MED ORDER — OMEPRAZOLE 40 MG PO CPDR: 40.0000 mg | DELAYED_RELEASE_CAPSULE | Freq: Every day | ORAL | 5 refills | Status: DC

## 2018-04-17 MED ORDER — AZITHROMYCIN 250 MG PO TABS: ORAL_TABLET | ORAL | 0 refills | Status: DC

## 2018-04-17 NOTE — Progress Notes (Signed)
Subjective:    Patient ID: Dennis Keller, male    DOB: 03-23-1966, 52 y.o.   MRN: [id number]  Dennis Keller is a 52 y.o. male presenting on 04/17/2018 for Gastroesophageal Reflux   HPI   FOLLOW-UP GERD Improved overall since 6-12/2017 after PPI, he has taken Omeprazole 40mg  daily for while with good results. Also took Sucralfate PRN meals/PM. Has a few left. Asking for refills and duration discussion - He has not tried to wean off PPI or stop. Denies abdominal pain, nausea vomiting,dark stool, blood in stool  Follow-up Episodic Lightheadedness / Sinusitis Improvement overall on Flonase, finished longer course of this with good results, and resolved sinus/dizzy flare-ups. He did endorse some developing sinus pressure and pain with congestion Denies fevers, chills, sinus pain or pressure congestion  Additionally he has trip for 2-3 weeks to [place] planned   Health Maintenance: Due TDap will get  Depression screen [facility] 2/9 12/31/2017 11/21/2017  Decreased Interest 0 0  Down, Depressed, Hopeless 0 0  PHQ - 2 Score 0 0    Past Medical History:  Diagnosis Date  . Kidney stone    Past Surgical History:  Procedure Laterality Date  . TONSILLECTOMY     Social History   Socioeconomic History  . Marital status: Married    Spouse name: Not on file  . Number of children: Not on file  . Years of education: College  . Highest education level: Not on file  Occupational History  . Occupation: Education officer, environmental  Social Needs  . Financial resource strain: Not on file  . Food insecurity:    Worry: Not on file    Inability: Not on file  . Transportation needs:    Medical: Not on file    Non-medical: Not on file  Tobacco Use  . Smoking status: Never Smoker  . Smokeless tobacco: Former Neurosurgeon    Types: Chew  . Tobacco comment: Smokeless tobacco for 10 years, quit 2007  Substance and Sexual Activity  . Alcohol use: Never    Frequency: Never  . Drug use: Never  . Sexual activity: Not on  file  Lifestyle  . Physical activity:    Days per week: Not on file    Minutes per session: Not on file  . Stress: Not on file  Relationships  . Social connections:    Talks on phone: Not on file    Gets together: Not on file    Attends religious service: Not on file    Active member of club or organization: Not on file    Attends meetings of clubs or organizations: Not on file    Relationship status: Not on file  . Intimate partner violence:    Fear of current or ex partner: Not on file    Emotionally abused: Not on file    Physically abused: Not on file    Forced sexual activity: Not on file  Other Topics Concern  . Not on file  Social History Narrative  . Not on file   Family History  Problem Relation Age of Onset  . Diabetes Mother   . Thyroid disease Mother   . Cancer Father 60       throat, smoker  . Stroke Father   . Prostate cancer Neg Hx   . Colon cancer Neg Hx    No current outpatient medications on file prior to visit.   No current facility-administered medications on file prior to visit.     Review of  Systems Per HPI unless specifically indicated above      Objective:    BP 115/85   Pulse 69   Temp 98.1 F (36.7 C) (Oral)   Resp 16   Ht 5\' 9"  (1.753 m)   Wt 185 lb 9.6 oz (84.2 kg)   BMI 27.41 kg/m   Wt Readings from Last 3 Encounters:  04/17/18 185 lb 9.6 oz (84.2 kg)  12/31/17 182 lb (82.6 kg)  11/21/17 183 lb (83 kg)    Physical Exam  Constitutional: He is oriented to person, place, and time. He appears well-developed and well-nourished. No distress.  Well-appearing, comfortable, cooperative  HENT:  Head: Normocephalic and atraumatic.  Mouth/Throat: Oropharynx is clear and moist.  Slight discomfort Frontal sinuses tender. Nares patent without purulence or edema. Bilateral TMs clear without erythema, effusion or bulging. Oropharynx clear without erythema, exudates, edema or asymmetry.  Eyes: Conjunctivae are normal. Right eye exhibits no  discharge. Left eye exhibits no discharge.  Cardiovascular: Normal rate.  Pulmonary/Chest: Effort normal.  Musculoskeletal: He exhibits no edema.  Neurological: He is alert and oriented to person, place, and time.  Skin: Skin is warm and dry. No rash noted. He is not diaphoretic. No erythema.  Psychiatric: He has a normal mood and affect. His behavior is normal.  Well groomed, good eye contact, normal speech and thoughts  Nursing note and vitals reviewed.  Results for orders placed or performed in visit on 11/21/17  CBC with Differential/Platelet  Result Value Ref Range   WBC 5.7 3.8 - 10.8 Thousand/uL   RBC 5.26 4.20 - 5.80 Million/uL   Hemoglobin 15.1 13.2 - 17.1 g/dL   HCT 60.4 54.0 - 98.1 %   MCV 86.3 80.0 - 100.0 fL   MCH 28.7 27.0 - 33.0 pg   MCHC 33.3 32.0 - 36.0 g/dL   RDW 19.1 47.8 - 29.5 %   Platelets 273 140 - 400 Thousand/uL   MPV 9.9 7.5 - 12.5 fL   Neutro Abs 3,038 1,500 - 7,800 cells/uL   Lymphs Abs 1,915 850 - 3,900 cells/uL   WBC mixed population 553 200 - 950 cells/uL   Eosinophils Absolute 154 15 - 500 cells/uL   Basophils Absolute 40 0 - 200 cells/uL   Neutrophils Relative % 53.3 %   Total Lymphocyte 33.6 %   Monocytes Relative 9.7 %   Eosinophils Relative 2.7 %   Basophils Relative 0.7 %  COMPLETE METABOLIC PANEL WITH GFR  Result Value Ref Range   Glucose, Bld 86 65 - 99 mg/dL   BUN 13 7 - 25 mg/dL   Creat 6.21 3.08 - 6.57 mg/dL   GFR, Est Non African American 103 > OR = 60 mL/min/1.22m2   GFR, Est African American 120 > OR = 60 mL/min/1.47m2   BUN/Creatinine Ratio NOT APPLICABLE 6 - 22 (calc)   Sodium 140 135 - 146 mmol/L   Potassium 4.5 3.5 - 5.3 mmol/L   Chloride 105 98 - 110 mmol/L   CO2 28 20 - 32 mmol/L   Calcium 9.4 8.6 - 10.3 mg/dL   Total Protein 7.1 6.1 - 8.1 g/dL   Albumin 4.4 3.6 - 5.1 g/dL   Globulin 2.7 1.9 - 3.7 g/dL (calc)   AG Ratio 1.6 1.0 - 2.5 (calc)   Total Bilirubin 0.6 0.2 - 1.2 mg/dL   Alkaline phosphatase (APISO) 52 40 -  115 U/L   AST 23 10 - 35 U/L   ALT 37 9 - 46 U/L  B. burgdorfi  antibodies  Result Value Ref Range   B burgdorferi Ab IgG+IgM <0.90 index  TSH  Result Value Ref Range   TSH 1.19 0.40 - 4.50 mIU/L  T4, free  Result Value Ref Range   Free T4 1.3 0.8 - 1.8 ng/dL  H. pylori breath test  Result Value Ref Range   H. pylori Breath Test NOT DETECTED NOT DETECT  UNLABELED  Result Value Ref Range   Message     Specimen Type 2 UB    Name on Specimen NO NAME    Test Ordered On Req 14,837   UNLABELED REQ TIQ  Result Value Ref Range   .     Contact [employer] ZACHE    Test Affected NONE    Reason UNLABELLED       Assessment & Plan:   Problem List Items Addressed This Visit    Allergic rhinitis due to allergen   Relevant Medications   fluticasone (FLONASE) 50 MCG/ACT nasal spray   Gastroesophageal reflux disease without esophagitis - Primary Well controlled on PPI daily has sucralfate PRN as well - has not used in a while  Plan Discussed options - goal to keep current PPI daily in few weeks after trip may taper down to OTC Omeprazole 20mg  daily - update office and we can help adjust med - see taper down in AVS    Relevant Medications   sucralfate (CARAFATE) 1 g tablet   omeprazole (PRILOSEC) 40 MG capsule    Other Visit Diagnoses    Epigastric abdominal pain    Resovled   Relevant Medications   omeprazole (PRILOSEC) 40 MG capsule   Acute non-recurrent frontal sinusitis      Consistent with early acute frontal rhinosinusitis, likely initially viral URI vs allergic rhinitis component without obvious concern of bacterial infection On Flonase  Plan: 1. Reassurance, likely self-limited - no indication for antibiotics at this time 2. Agreed to provide printed rx antibiotic with Azithromycin (has PCN allergy) as a back up plan only for if sinusitis worsened, may fill >48-72 hours if need only Return criteria reviewed     Relevant Medications   fluticasone (FLONASE) 50 MCG/ACT  nasal spray   azithromycin (ZITHROMAX Z-PAK) 250 MG tablet   Need for diphtheria-tetanus-pertussis (Tdap) vaccine       Relevant Orders   Tdap vaccine greater than or equal to 7yo IM (Completed)      Meds ordered this encounter  Medications  . sucralfate (CARAFATE) 1 g tablet    Sig: Take 1 tablet (1 g total) by mouth 4 (four) times daily -  with meals and at bedtime. As needed only if acid reflux / abdominal pain    Dispense:  30 tablet    Refill:  0  . omeprazole (PRILOSEC) 40 MG capsule    Sig: Take 1 capsule (40 mg total) by mouth daily before breakfast.    Dispense:  30 capsule    Refill:  5  . fluticasone (FLONASE) 50 MCG/ACT nasal spray    Sig: Place 2 sprays into both nostrils daily. Use for 4-6 weeks then stop and use seasonally or as needed.    Dispense:  16 g    Refill:  3  . azithromycin (ZITHROMAX Z-PAK) 250 MG tablet    Sig: Take 2 tabs (500mg  total) on Day 1. Take 1 tab (250mg ) daily for next 4 days.    Dispense:  6 tablet    Refill:  0    Follow up plan: Return in  about 1 year (around 04/18/2019) for Annual Physical.  Future labs ordered  [name], DO [facility] 04/17/2018, 10:42 AM

## 2018-04-17 NOTE — Patient Instructions (Addendum)
Thank you for coming to the office today.  Refill Omeprazole 40mg  daily for now with refills  Wait until return from trip to and right time to taper off Omeprazole  When ready get a pack of 20mg  omeprazole from OTC -  1 - Take 40mg  one day then 20mg  next - alternating doses for 1 week 2 - Take 20mg  EVERY day for 1 week 3 - Take 20mg  every OTHER day and skip a dose other day 4 - Space out further 20mg  every 2-3 days gradually stop completely  If you do it slowly can avoid the rebound symptoms - may not need it completely afterwards. In future - if need something we can continue on omeprazole 20mg  daily or just for short courses.  Refilled Sucralfate ONLY if need   Please schedule a Follow-up Appointment to: Return in about 1 year (around 04/18/2019) for Annual Physical.  If you have any other questions or concerns, please feel free to call the office or send a message through MyChart. You may also schedule an earlier appointment if necessary.  Additionally, you may be receiving a survey about your experience at our office within a few days to 1 week by e-mail or mail. We value your feedback.  [name], DO [facility], [place]

## 2018-07-25 ENCOUNTER — Ambulatory Visit (INDEPENDENT_AMBULATORY_CARE_PROVIDER_SITE_OTHER): Payer: 59 | Admitting: Family Medicine

## 2018-07-25 ENCOUNTER — Other Ambulatory Visit: Payer: Self-pay

## 2018-07-25 ENCOUNTER — Encounter: Payer: Self-pay | Admitting: Family Medicine

## 2018-07-25 VITALS — BP 126/82 | HR 73 | Temp 98.1°F | Resp 16 | Ht 69.0 in | Wt 186.0 lb

## 2018-07-25 DIAGNOSIS — J111 Influenza due to unidentified influenza virus with other respiratory manifestations: Secondary | ICD-10-CM

## 2018-07-25 MED ORDER — BALOXAVIR MARBOXIL(40 MG DOSE) 2 X 20 MG PO TBPK: 40.0000 mg | ORAL_TABLET | Freq: Once | ORAL | 0 refills | Status: AC

## 2018-07-25 MED ORDER — BENZONATATE 100 MG PO CAPS: 100.0000 mg | ORAL_CAPSULE | Freq: Three times a day (TID) | ORAL | 0 refills | Status: DC | PRN

## 2018-07-25 NOTE — Patient Instructions (Addendum)
Thank you for coming to the office today.  You have symptoms of Influenza  Start sample Fox medicine, 2 pills and then done today  - Wash hands and cover cough very well to avoid spread of infection - For symptom control:      - Take Ibuprofen / Advil 400-600mg  every 6-8 hours as needed for fever / muscle aches, and may also take Tylenol 500-1000mg  per dose every 6-8 hours or 3 times a day, can alternate dosing      - Start Tessalon perls one every 8 hours or 3 times a day as needed for cough     - Start OTC Mucinex-DM for cough and congestion for up to 7 days - Improve hydration with plenty of clear fluids  If significant worsening with poor fluid intake, worsening fever, difficulty breathing due to coughing, worsening body aches, weakness, or other more concerning symptoms difficulty breathing you can seek treatment at Emergency Department. Also if improved flu symptoms and then worsening days to week later with concerns for bronchitis, productive cough fever chills again we may need to check for possible pneumonia that can occur after the flu   Please schedule a Follow-up Appointment to: Return in about 1 week (around 08/01/2018), or if symptoms worsen or fail to improve, for flu.  If you have any other questions or concerns, please feel free to call the office or send a message through MyChart. You may also schedule an earlier appointment if necessary.  Additionally, you may be receiving a survey about your experience at our office within a few days to 1 week by e-mail or mail. We value your feedback.  [name], DO [facility], [place]

## 2018-07-25 NOTE — Progress Notes (Signed)
Subjective:    Patient ID: JANZEN KLEBE, male    DOB: 1966-03-03, 53 y.o.   MRN: [id number]  ARTAVIUS GANTER is a 52 y.o. male presenting on 07/25/2018 for Cough (onset 3 weeks nasal drainage, from past 2 week had taken Z pack for 5 days improved his Sxs but after 4 days started feeling sick, chills, HA, bodyache onset 4 days )   HPI  INFLUENZA - Recently 2-3 weeks sinus symptoms - gradually improved. He took Z-pak recently about 2 week ago all symptoms resolved congestion and drainage, 4-5 days without symptoms, and then symptoms now with congestion and cough returning in past 2-3 days and developed subjective fever, chills, headache, bodyache - Takes Theraflu PRN - Admits some sick contacts with flu at church and also he is in and out of hospital - No international travel in past >3 months - Denies any nausea vomiting abdominal pain  Health Maintenance: Did not get Flu Vaccine  Depression screen [facility] 2/9 07/25/2018 12/31/2017 11/21/2017  Decreased Interest 0 0 0  Down, Depressed, Hopeless 0 0 0  PHQ - 2 Score 0 0 0    Social History   Tobacco Use  . Smoking status: Never Smoker  . Smokeless tobacco: Former Neurosurgeon    Types: Chew  . Tobacco comment: Smokeless tobacco for 10 years, quit 2007  Substance Use Topics  . Alcohol use: Never    Frequency: Never  . Drug use: Never    Review of Systems Per HPI unless specifically indicated above     Objective:    BP 126/82   Pulse 73   Temp 98.1 F (36.7 C) (Oral)   Resp 16   Ht 5\' 9"  (1.753 m)   Wt 186 lb (84.4 kg)   SpO2 99%   BMI 27.47 kg/m   Wt Readings from Last 3 Encounters:  07/25/18 186 lb (84.4 kg)  04/17/18 185 lb 9.6 oz (84.2 kg)  12/31/17 182 lb (82.6 kg)    Physical Exam Vitals signs and nursing note reviewed.  Constitutional:      General: He is not in acute distress.    Appearance: He is well-developed. He is not diaphoretic.     Comments: Well-appearing, comfortable, cooperative  HENT:     Head:  Normocephalic and atraumatic.     Comments: Frontal / maxillary sinuses non-tender. Nares patent without purulence or edema. Bilateral TMs clear without erythema, effusion or bulging. Oropharynx clear without erythema, exudates, edema or asymmetry. Eyes:     General:        Right eye: No discharge.        Left eye: No discharge.     Conjunctiva/sclera: Conjunctivae normal.  Neck:     Musculoskeletal: Normal range of motion and neck supple.     Thyroid: No thyromegaly.  Cardiovascular:     Rate and Rhythm: Normal rate and regular rhythm.     Heart sounds: Normal heart sounds. No murmur.  Pulmonary:     Effort: Pulmonary effort is normal. No respiratory distress.     Breath sounds: Rhonchi (mild scattered rhonchi clear with cough) present. No wheezing or rales.     Comments: Occasional coughing. Musculoskeletal: Normal range of motion.  Lymphadenopathy:     Cervical: No cervical adenopathy.  Skin:    General: Skin is warm and dry.     Findings: No erythema or rash.  Neurological:     Mental Status: He is alert and oriented to person, place, and time.  Psychiatric:        Behavior: Behavior normal.     Comments: Well groomed, good eye contact, normal speech and thoughts        Assessment & Plan:   Problem List Items Addressed This Visit    None    Visit Diagnoses    Influenza    -  Primary   Relevant Medications   benzonatate (TESSALON) 100 MG capsule   Baloxavir Marboxil,40 MG Dose, (XOFLUZA) 2 x 20 MG TBPK      Clinically diagnosed influenza despite negative rapid flu test today, concern for flu still due to significant known exposure to positive influenza - Duration x 2-3 days, without complication. Tolerating PO and well hydrated - No other focal findings of infection today - Did not receive influenza vaccine this season -  Plan: 1. Start Xofluza 40mg  dose (x2 of the 20mg  pills = 1 dose) - sample given 2. Supportive care as advised with NSAID / Tylenol PRN  fever/myalgias, improve hydration, may take OTC Cold/Flu meds 3. Start Tessalon Perls take 1 capsule up to 3 times a day as needed for cough 4. Return criteria given if significant worsening, consider post-influenza complications, otherwise follow-up if needed   Meds ordered this encounter  Medications  . benzonatate (TESSALON) 100 MG capsule    Sig: Take 1 capsule (100 mg total) by mouth 3 (three) times daily as needed for cough.    Dispense:  30 capsule    Refill:  0  . Baloxavir Marboxil,40 MG Dose, (XOFLUZA) 2 x 20 MG TBPK    Sig: Take 40 mg by mouth once for 1 dose. For Flu    Dispense:  1 each    Refill:  0    Follow up plan: Return in about 1 week (around 08/01/2018), or if symptoms worsen or fail to improve, for flu.   [name], DO [facility] 07/25/2018, 11:29 AM

## 2018-09-09 ENCOUNTER — Other Ambulatory Visit: Payer: Self-pay | Admitting: Family Medicine

## 2018-09-09 DIAGNOSIS — R1013 Epigastric pain: Secondary | ICD-10-CM

## 2018-09-09 DIAGNOSIS — K219 Gastro-esophageal reflux disease without esophagitis: Secondary | ICD-10-CM

## 2018-11-24 ENCOUNTER — Other Ambulatory Visit: Payer: Self-pay

## 2018-11-24 ENCOUNTER — Encounter: Payer: Self-pay | Admitting: Family Medicine

## 2018-11-24 ENCOUNTER — Ambulatory Visit (INDEPENDENT_AMBULATORY_CARE_PROVIDER_SITE_OTHER): Payer: 59 | Admitting: Family Medicine

## 2018-11-24 DIAGNOSIS — N50811 Right testicular pain: Secondary | ICD-10-CM

## 2018-11-24 DIAGNOSIS — N401 Enlarged prostate with lower urinary tract symptoms: Secondary | ICD-10-CM | POA: Diagnosis not present

## 2018-11-24 DIAGNOSIS — N529 Male erectile dysfunction, unspecified: Secondary | ICD-10-CM

## 2018-11-24 DIAGNOSIS — N138 Other obstructive and reflux uropathy: Secondary | ICD-10-CM

## 2018-11-24 NOTE — Progress Notes (Signed)
Virtual Visit via Telephone The purpose of this virtual visit is to provide medical care while limiting exposure to the novel coronavirus (COVID19) for both patient and office staff.  Consent was obtained for phone visit:  Yes.   Answered questions that patient had about telehealth interaction:  Yes.   I discussed the limitations, risks, security and privacy concerns of performing an evaluation and management service by telephone. I also discussed with the patient that there may be a patient responsible charge related to this service. The patient expressed understanding and agreed to proceed.  Patient Location: Home [facility]  ---------------------------------------------------------------------- Chief Complaint  Patient presents with  . Benign Prostatic Hypertrophy    S: Reviewed CMA documentation. I have called patient and gathered additional HPI as follows:  BPH with LUTS / ED He has chronic history in past of ED and BPH in the past by clinical report, but no formal diagnosis, he was taking Nugenix Testosterone supplement and Natural Herbal for BPH Saw Palmetto - Reports that symptoms started about 2 weeks ago developed additional symptoms of some pain in tip of penis feels like urge to urinate - mostly a discomfort, seems to come and go. When he does urinate, he has no pain, and no blood in urine. Also has some throbbing pain in scrotum, episodic mostly R sided testicular region - both of these symptoms seem episodic lasting for a while, minutes to hours, mild discomfort seems persistent.  He is also requesting PSA blood test for Prostate Cancer Screening.  AUA BPH Symptom Score over past 1 month 1. Sensation of not emptying bladder post void - 0 2. Urinate less than 2 hour after finish last void - 2 3. Start/Stop several times during void - 0 4. Difficult to postpone urination - 0 5. Weak urinary stream - 3 6. Push or strain urination -  0 7. Nocturia - 0 times  Total Score: 5 (Mild BPH symptoms)   Denies any high risk travel to areas of current concern for COVID19. Denies any known or suspected exposure to person with or possibly with COVID19.  Denies any fevers, chills, sweats, body ache, cough, shortness of breath, sinus pain or pressure, headache, abdominal pain, diarrhea  Past Medical History:  Diagnosis Date  . Kidney stone    Social History   Tobacco Use  . Smoking status: Never Smoker  . Smokeless tobacco: Former NeurosurgeonUser    Types: Chew  . Tobacco comment: Smokeless tobacco for 10 years, quit 2007  Substance Use Topics  . Alcohol use: Never    Frequency: Never  . Drug use: Never    Current Outpatient Medications:  .  omeprazole (PRILOSEC) 40 MG capsule, TAKE 1 CAPSULE BY MOUTH EVERY DAY BEFORE BREAKFAST, Disp: 30 capsule, Rfl: 5 .  sucralfate (CARAFATE) 1 g tablet, Take 1 tablet (1 g total) by mouth 4 (four) times daily -  with meals and at bedtime. As needed only if acid reflux / abdominal pain, Disp: 30 tablet, Rfl: 0 .  benzonatate (TESSALON) 100 MG capsule, Take 1 capsule (100 mg total) by mouth 3 (three) times daily as needed for cough. (Patient not taking: Reported on 11/24/2018), Disp: 30 capsule, Rfl: 0 .  fluticasone (FLONASE) 50 MCG/ACT nasal spray, Place 2 sprays into both nostrils daily. Use for 4-6 weeks then stop and use seasonally or as needed. (Patient not taking: Reported on 11/24/2018), Disp: 16 g, Rfl: 3  [facility] 11/24/2018 07/25/2018 12/31/2017  Decreased Interest 0 0 0  Down, Depressed, Hopeless 0 0 0  PHQ - 2 Score 0 0 0    No flowsheet data found.  -------------------------------------------------------------------------- O: No physical exam performed due to remote telephone encounter.  Lab results reviewed.  No results found for this or any previous visit (from the past 2160  hour(s)).  -------------------------------------------------------------------------- A&P:  Problem List Items Addressed This Visit    None    Visit Diagnoses    BPH with obstruction/lower urinary tract symptoms    -  Primary   Relevant Orders   Ambulatory referral to Urology   Testicular pain, right       Relevant Orders   Ambulatory referral to Urology   Erectile dysfunction, unspecified erectile dysfunction type       Relevant Orders   Ambulatory referral to Urology     Uncertain exact etiology of his symptoms, seems constellation of urological issues, mostly related to BPH it seems but now has more localized testicular complaints and chronic ED - Based on low AUA BPH score 5, seems less likely to be resolved by treating BPH at this time alone  Referral to [facility] [place] for evaluation of several urological complaints, recent worsening 2 weeks of urinary urgency without increased frequency, some discomfort at tip of penis and R testicular discomfort episodes, also with Erectile Dysfunction, and history of BPH but no formal diagnosis. He was on natural remedy saw palmetto and testosterone natural supplement, requesting evaluation by Urology may warrant bladder scan, PSA lab per patient request, and consider treatments for BPH.  No orders of the defined types were placed in this encounter.  Orders Placed This Encounter  Procedures  . Ambulatory referral to Urology    Referral Priority:   Routine    Referral Type:   Consultation    Referral Reason:   Specialty Services Required    Requested Specialty:   Urology    Number of Visits Requested:   1     Follow-up: - Return in 3-5 months for annual physical - Future labs ordered previously, in system, will cancel PSA lab - and defer this to Urology  Patient verbalizes understanding with the above medical recommendations including the limitation of remote medical advice.  Specific follow-up and call-back criteria were  given for patient to follow-up or seek medical care more urgently if needed.   - Time spent in direct consultation with patient on phone: 12 minutes  [name], DO [facility] 11/24/2018, 2:41 PM

## 2018-11-24 NOTE — Patient Instructions (Addendum)
Referral to Urology - stay tuned for apt  [facility] -1st floor [address] [place],  [place]  [address] Phone: [phone]  They will likely check your PSA - we can check it if you need - please CALL OUR OFFICE AND REQUEST THAT WE CHECK PSA - IF the Urologist does not do this blood test.  DUE for FASTING BLOOD WORK (no food or drink after midnight before the lab appointment, only water or coffee without cream/sugar on the morning of)  SCHEDULE "Lab Only" visit in the morning at the clinic for lab draw in 3-5 MONTHS   - Make sure Lab Only appointment is at about 1 week before your next appointment, so that results will be available  For Lab Results, once available within 2-3 days of blood draw, you can can log in to MyChart online to view your results and a brief explanation. Also, we can discuss results at next follow-up visit.   Please schedule a Follow-up Appointment to: Return in about 3 months (around 02/24/2019) for Annual Physical.  If you have any other questions or concerns, please feel free to call the office or send a message through MyChart. You may also schedule an earlier appointment if necessary.  Additionally, you may be receiving a survey about your experience at our office within a few days to 1 week by e-mail or mail. We value your feedback.  [name], DO [facility], [place]

## 2018-12-29 ENCOUNTER — Encounter: Payer: Self-pay | Admitting: Family Medicine

## 2018-12-29 ENCOUNTER — Telehealth: Payer: Self-pay | Admitting: *Deleted

## 2018-12-29 ENCOUNTER — Other Ambulatory Visit: Payer: Self-pay

## 2018-12-29 ENCOUNTER — Ambulatory Visit (INDEPENDENT_AMBULATORY_CARE_PROVIDER_SITE_OTHER): Payer: 59 | Admitting: Family Medicine

## 2018-12-29 DIAGNOSIS — K219 Gastro-esophageal reflux disease without esophagitis: Secondary | ICD-10-CM

## 2018-12-29 DIAGNOSIS — Z20822 Contact with and (suspected) exposure to covid-19: Secondary | ICD-10-CM

## 2018-12-29 DIAGNOSIS — R0982 Postnasal drip: Secondary | ICD-10-CM | POA: Diagnosis not present

## 2018-12-29 DIAGNOSIS — Z20828 Contact with and (suspected) exposure to other viral communicable diseases: Secondary | ICD-10-CM | POA: Diagnosis not present

## 2018-12-29 MED ORDER — OMEPRAZOLE 40 MG PO CPDR: 40.0000 mg | DELAYED_RELEASE_CAPSULE | Freq: Every day | ORAL | 1 refills | Status: DC

## 2018-12-29 MED ORDER — SUCRALFATE 1 G PO TABS: 1.0000 g | ORAL_TABLET | Freq: Three times a day (TID) | ORAL | 0 refills | Status: DC

## 2018-12-29 NOTE — Patient Instructions (Addendum)
Stay tuned for a call back from the [facility] - they will call you and arrange this today. If you do not hear back, can call them at [phone].  If negative test - they will call you with result. If abnormal or positive test you will be notified as well and our office will contact you to help further with treatment plan.  Your symptoms are mild at this time, and may not warrant any treatment, and this may not be COVID19. To be determined right now.  REQUIRED self quarantine to [place] - advised to avoid all exposure with others while during TESTING - If test is negative you can return to work. If symptoms - Should continue to quarantine for up to 7-10 days, pending resolution of symptoms, if symptoms resolve by 7 days and is afebrile >3 days - may STOP self quarantine at that time.  If symptoms do not resolve or significantly improve OR if WORSENING - fever / cough - or worsening shortness of breath - then should contact us and seek advice on next steps in treatment at home vs where/when to seek care at Urgent Care or Hospital ED for further intervention   Please schedule a Follow-up Appointment to: Return in about 1 week (around 01/05/2019), or if symptoms worsen or fail to improve.  If you have any other questions or concerns, please feel free to call the office or send a message through [place]. You may also schedule an earlier appointment if necessary.  Additionally, you may be receiving a survey about your experience at our office within a few days to 1 week by e-mail or mail. We value your feedback.  Nobie Putnam, DO [place]

## 2018-12-29 NOTE — Progress Notes (Signed)
Virtual Visit via Telephone The purpose of this virtual visit is to provide medical care while limiting exposure to the novel coronavirus (COVID19) for both patient and office staff.  Consent was obtained for phone visit:  Yes.   Answered questions that patient had about telehealth interaction:  Yes.   I discussed the limitations, risks, security and privacy concerns of performing an evaluation and management service by telephone. I also discussed with the patient that there may be a patient responsible charge related to this service. The patient expressed understanding and agreed to proceed.  Patient Location: Home Provider Location: [facility])   ---------------------------------------------------------------------- Chief Complaint  Patient presents with  . covid exposure    sinus drainge onset 2 weeks, had exposure with person who tested positive 10 days ago denies any symptoms     S: Reviewed CMA documentation. I have called patient and gathered additional HPI as follows:  [place] Reports that symptoms started about 9 days ago he was at church and there was a contact with a cleaning person at the church at a distance across hall he had a conversation with her, he was notified later that she was found to have fever and lost taste and she tested positive. He was out of town this past weekend, but unable to get into other clinic to get tested, he was advised to contact us today. - he is currently asymptomatic.  Allergic Rhinitis / Sinus drainage He takes flonase nightly before bed and less often taking allergy pill because they make him sluggish. He says sinus congestion is persistent in his throat with drainage. Admits mild sore throat without severe pain.  Additional update GERD - out of his antacid. He was taking Omeprazole 40mg  daily, was working, he forgot to take it while on vacation and symptoms returned somewhat, it is improving, he takes  Sucralfate very rarely if flare up, would like refills    Patient currently works at [employer] Denies any high risk travel to areas of current concern for [place]. Admits one exposure as listed above  Denies any fevers, chills, sweats, body ache, cough, shortness of breath, sinus pain or pressure, headache, abdominal pain, diarrhea  -------------------------------------------------------------------------- O: No physical exam performed due to remote telephone encounter.  -------------------------------------------------------------------------- A&P:  EXPOSURE TO COVID19 Known exposure, he is asymptomatic except some sinus drainage with allergic rhinitis for few weeks now, seems unrelated. Afebrile without cough or dyspnea.  GERD Flare up off med, otherwise controlled on PPI Needs refills   - Reassuring without high risk symptoms - Afebrile, without dyspnea - No comorbid pulmonary conditions (asthma, COPD) or immunocompromise  Ordered COVID19 testing through [place] testing pool - they will contact patient via mychart / telephone to proceed with arranging date/time testing. Symptomatic medications as recommended for his symptoms Continue Flonase and allergy pill Remain out of work, no note needed at this time, - pending test result  Meds ordered this encounter  Medications  . omeprazole (PRILOSEC) 40 MG capsule    Sig: Take 1 capsule (40 mg total) by mouth daily.    Dispense:  90 capsule    Refill:  1  . sucralfate (CARAFATE) 1 g tablet    Sig: Take 1 tablet (1 g total) by mouth 4 (four) times daily -  with meals and at bedtime. As needed only if acid reflux / abdominal pain    Dispense:  30 tablet    Refill:  0   REQUIRED self quarantine to  AVOID POTENTIAL SPREAD - advised to avoid all exposure with others while during TESTING - pending result, if negative return to work, otherwise would require up to 10 days after positive test is resulted.. Should continue to quarantine  for up to 7-10 days, pending resolution of symptoms, if symptoms resolve by 7 days and is afebrile >3 days - may STOP self quarantine at that time.   If symptoms do not resolve or significantly improve OR if WORSENING - fever / cough - or worsening shortness of breath - then should contact us and seek advice on next steps in treatment at home vs where/when to seek care at Urgent Care or Hospital ED for further intervention and possible testing if indicated.  Patient verbalizes understanding with the above medical recommendations including the limitation of remote medical advice.  Specific follow-up / call-back criteria were given for patient to follow-up or seek medical care more urgently if needed.   - Time spent in direct consultation with patient on phone: 12 minutes  Saralyn PilarAlexander Karamalegos, DO [facility] 12/29/2018, 9:23 AM

## 2018-12-29 NOTE — Telephone Encounter (Signed)
Request COVID19 Testing Received: Today Message Contents  Dennis Hauser, DO  [name]   53 y.o., 17-Aug-1965, Dennis Keller  MRN: [id number]   [phone] (Mobile)   MyChart Active   Close contact exposure to [place] about 9 days ago, they notified him recently that they tested positive. He is asymptomatic at this time, but does have some sinus symptoms from prior to the exposure. Requesting COVID19 testing prior to return to work.   Dennis Keller, [facility]  12/29/2018, 9:42 AM    Pt called and scheduled for testing at [facility] location on 12/30/18. Pt advised to remain in car and to wear a mask at scheduled appt. Pt verbalized understanding.

## 2018-12-30 ENCOUNTER — Other Ambulatory Visit: Payer: Self-pay

## 2019-01-03 LAB — NOVEL CORONAVIRUS, NAA: SARS-CoV-2, NAA: NOT DETECTED

## 2019-01-06 ENCOUNTER — Ambulatory Visit (INDEPENDENT_AMBULATORY_CARE_PROVIDER_SITE_OTHER): Payer: 59 | Admitting: Urology

## 2019-01-06 ENCOUNTER — Encounter: Payer: Self-pay | Admitting: Urology

## 2019-01-06 ENCOUNTER — Other Ambulatory Visit: Payer: Self-pay

## 2019-01-06 VITALS — BP 120/78 | HR 69 | Ht 69.0 in | Wt 183.9 lb

## 2019-01-06 DIAGNOSIS — N5201 Erectile dysfunction due to arterial insufficiency: Secondary | ICD-10-CM

## 2019-01-06 DIAGNOSIS — N401 Enlarged prostate with lower urinary tract symptoms: Secondary | ICD-10-CM

## 2019-01-06 NOTE — Progress Notes (Addendum)
01/06/2019 10:35 AM   Talmage Coin 1965/12/18 [id number]  Referring provider: Olin Hauser, DO [address] [place],  [place] [address]  Chief Complaint  Patient presents with  . Erectile Dysfunction  . Benign Prostatic Hypertrophy    HPI: Dennis Keller is a 53 year old male seen in consultation at the request of Dr. Parks Ranger for evaluation of lower urinary tract symptoms, pelvic pain and erectile dysfunction. He had a virtual visit with Dr. Parks Ranger on 11/24/2018 with a 2-week history of discomfort of the glans penis associated with a sense of urinary urgency.  He denied dysuria or gross hematuria.  He had mild right hemiscrotal discomfort.  He states the symptoms spontaneously resolved 1 week after his visit and he has been asymptomatic.  He has mild lower urinary tract symptoms consisting of occasional frequency, decreased stream and sensation of incomplete emptying.  IPSS completed today was 3/1. Denies dysuria, gross hematuria or flank/abdominal/pelvic/scrotal pain.  He does take saw palmetto.  He has friends who have been recently diagnosed with prostate cancer and was concerned about this possibility  He also has mild ED with SHIM score 19/25.   PMH: Past Medical History:  Diagnosis Date  . BPH (benign prostatic hyperplasia)   . ED (erectile dysfunction)   . Kidney stone     Surgical History: Past Surgical History:  Procedure Laterality Date  . TONSILLECTOMY      Home Medications:  Allergies as of 01/06/2019      Reactions   Penicillin G Other (See Comments)      Medication List       Accurate as of January 06, 2019 11:59 PM. If you have any questions, ask your nurse or doctor.        fluticasone 50 MCG/ACT nasal spray Commonly known as: FLONASE Place 2 sprays into both nostrils daily. Use for 4-6 weeks then stop and use seasonally or as needed.   omeprazole 40 MG capsule Commonly known as: PRILOSEC Take 1 capsule (40 mg total) by mouth  daily.   sucralfate 1 g tablet Commonly known as: Carafate Take 1 tablet (1 g total) by mouth 4 (four) times daily -  with meals and at bedtime. As needed only if acid reflux / abdominal pain       Allergies:  Allergies  Allergen Reactions  . Penicillin G Other (See Comments)    Family History: Family History  Problem Relation Age of Onset  . Diabetes Mother   . Thyroid disease Mother   . Cancer Father 70       throat, smoker  . Stroke Father   . Prostate cancer Neg Hx   . Colon cancer Neg Hx     Social History:  reports that he has never smoked. He quit smokeless tobacco use about 13 years ago.  His smokeless tobacco use included chew. He reports that he does not drink alcohol or use drugs.  ROS: UROLOGY Frequent Urination?: No Hard to postpone urination?: No Burning/pain with urination?: No Get up at night to urinate?: No Leakage of urine?: No Urine stream starts and stops?: No Trouble starting stream?: No Do you have to strain to urinate?: No Blood in urine?: No Urinary tract infection?: No Sexually transmitted disease?: No Injury to kidneys or bladder?: No Painful intercourse?: No Weak stream?: No Erection problems?: No Penile pain?: No  Gastrointestinal Nausea?: No Vomiting?: No Indigestion/heartburn?: No Diarrhea?: No Constipation?: No  Constitutional Fever: No Night sweats?: No Weight loss?: No Fatigue?: No  Skin Skin rash/lesions?: No Itching?: No  Eyes Blurred vision?: No Double vision?: No  Ears/Nose/Throat Sore throat?: No Sinus problems?: No  Hematologic/Lymphatic Swollen glands?: No Easy bruising?: No  Cardiovascular Leg swelling?: No Chest pain?: No  Respiratory Cough?: No Shortness of breath?: No  Endocrine Excessive thirst?: No  Musculoskeletal Back pain?: No Joint pain?: No  Neurological Headaches?: No Dizziness?: No  Psychologic Depression?: No Anxiety?: No  Physical Exam: BP 120/78 (BP Location:  Left Arm, Patient Position: Sitting, Cuff Size: Normal)   Pulse 69   Ht 5\' 9"  (1.753 m)   Wt 183 lb 14.4 oz (83.4 kg)   BMI 27.16 kg/m   Constitutional:  Alert and oriented, No acute distress. HEENT:  AT, moist mucus membranes.  Trachea midline, no masses. Cardiovascular: No clubbing, cyanosis, or edema. Respiratory: Normal respiratory effort, no increased work of breathing. GI: Abdomen is soft, nontender, nondistended, no abdominal masses GU: No CVA tenderness.  Prostate 45 g, smooth without nodules Skin: No rashes, bruises or suspicious lesions. Neurologic: Grossly intact, no focal deficits, moving all 4 extremities. Psychiatric: Normal mood and affect.   Assessment & Plan:   53 year old male with recent episode of pelvic discomfort including the glans penis and right hemiscrotum.  The symptoms are in line with noninfectious prostatic inflammation and they have resolved.  We discussed prostate cancer screening with PSA.  The pros and cons were discussed and he has elected to have his PSA drawn.  He will be notified with the results.  He has mild ED but was interested in a trial of PDE 5 medication.  Rx tadalafil 10 mg was sent to pharmacy  Return in about 1 year (around 01/06/2020) for Recheck.   Riki AltesScott C Ellery Tash, MD  [facility] [address], [address] [place], [place] [address] [phone]

## 2019-01-06 NOTE — Patient Instructions (Signed)

## 2019-01-07 ENCOUNTER — Telehealth: Payer: Self-pay | Admitting: Urology

## 2019-01-07 LAB — PSA: Prostate Specific Ag, Serum: 1.9 ng/mL (ref 0.0–4.0)

## 2019-01-07 MED ORDER — TADALAFIL 10 MG PO TABS: ORAL_TABLET | ORAL | 3 refills | Status: DC

## 2019-01-07 NOTE — Telephone Encounter (Signed)
Pt called and states that he went to [facility] to pick up his Tadalafil and it was not there. Please advise.

## 2019-01-07 NOTE — Telephone Encounter (Signed)
mychart notification sent on PSA rsults

## 2019-01-07 NOTE — Telephone Encounter (Signed)
Can you clarify, looks like your note says pt didn't want medication however the pt went to the pharmacy.

## 2019-01-07 NOTE — Telephone Encounter (Signed)
-----   Message from Abbie Sons, MD sent at 01/07/2019  7:21 AM EDT ----- PSA was normal at 1.9

## 2019-07-12 ENCOUNTER — Other Ambulatory Visit: Payer: Self-pay | Admitting: Family Medicine

## 2019-07-12 DIAGNOSIS — K219 Gastro-esophageal reflux disease without esophagitis: Secondary | ICD-10-CM

## 2020-01-07 ENCOUNTER — Other Ambulatory Visit: Payer: Self-pay

## 2020-01-07 ENCOUNTER — Encounter: Payer: Self-pay | Admitting: Urology

## 2020-01-07 ENCOUNTER — Ambulatory Visit (INDEPENDENT_AMBULATORY_CARE_PROVIDER_SITE_OTHER): Payer: 59 | Admitting: Urology

## 2020-01-07 VITALS — BP 147/86 | HR 77 | Ht 69.0 in | Wt 177.0 lb

## 2020-01-07 DIAGNOSIS — N400 Enlarged prostate without lower urinary tract symptoms: Secondary | ICD-10-CM | POA: Insufficient documentation

## 2020-01-07 DIAGNOSIS — N401 Enlarged prostate with lower urinary tract symptoms: Secondary | ICD-10-CM | POA: Diagnosis not present

## 2020-01-07 DIAGNOSIS — N529 Male erectile dysfunction, unspecified: Secondary | ICD-10-CM

## 2020-01-07 MED ORDER — TADALAFIL 10 MG PO TABS: ORAL_TABLET | ORAL | 3 refills | Status: DC

## 2020-01-07 NOTE — Progress Notes (Signed)
01/07/2020 1:24 PM   [name] 08/28/1965 [id number]  Referring provider: Smitty Cords, DO [address] [place],  [place] [address]  Chief Complaint  Patient presents with   Benign Prostatic Hypertrophy    HPI: 54 y.o. male presents for annual follow-up.   Seen last year after an episode of penile pain and urgency which resolved  Mild ED and elected a trial of tadalafil which he states was effective  Mild lower urinary tract symptoms which have not changed and are not bothersome  PSA was 1.9   PMH: Past Medical History:  Diagnosis Date   BPH (benign prostatic hyperplasia)    ED (erectile dysfunction)    Kidney stone     Surgical History: Past Surgical History:  Procedure Laterality Date   TONSILLECTOMY      Home Medications:  Allergies as of 01/07/2020      Reactions   Penicillin G Other (See Comments)      Medication List       Accurate as of January 07, 2020  1:24 PM. If you have any questions, ask your nurse or doctor.        fluticasone 50 MCG/ACT nasal spray Commonly known as: FLONASE Place 2 sprays into both nostrils daily. Use for 4-6 weeks then stop and use seasonally or as needed.   omeprazole 40 MG capsule Commonly known as: PRILOSEC TAKE 1 CAPSULE BY MOUTH EVERY DAY   sucralfate 1 g tablet Commonly known as: Carafate Take 1 tablet (1 g total) by mouth 4 (four) times daily -  with meals and at bedtime. As needed only if acid reflux / abdominal pain   tadalafil 10 MG tablet Commonly known as: Cialis 1 tab by mouth 1 hour prior to intercourse       Allergies:  Allergies  Allergen Reactions   Penicillin G Other (See Comments)    Family History: Family History  Problem Relation Age of Onset   Diabetes Mother    Thyroid disease Mother    Cancer Father 82       throat, smoker   Stroke Father    Prostate cancer Neg Hx    Colon cancer Neg Hx     Social History:  reports that he has never smoked. He  quit smokeless tobacco use about 14 years ago.  His smokeless tobacco use included chew. He reports that he does not drink alcohol and does not use drugs.   Physical Exam: BP (!) 147/86    Pulse 77    Ht 5\' 9"  (1.753 m)    Wt 177 lb (80.3 kg)    BMI 26.14 kg/m   Constitutional:  Alert and oriented, No acute distress. HEENT: [place] AT, moist mucus membranes.  Trachea midline, no masses. Cardiovascular: No clubbing, cyanosis, or edema. Respiratory: Normal respiratory effort, no increased work of breathing. Skin: No rashes, bruises or suspicious lesions. Neurologic: Grossly intact, no focal deficits, moving all 4 extremities. Psychiatric: Normal mood and affect.    Assessment & Plan:    1.  BPH with mild LUTS  Symptoms not bothersome  Prior episode most likely secondary to inflammatory prostatitis  We discussed recommended prostate cancer screening guidelines with PSA/DRE between the ages of 26-69  2.  Erectile dysfunction  Tadalafil refill  If Dr. 51-72 will take over tadalafil refills will see him back as needed.   Althea Charon, MD  [facility] [address], [address] [place], [place] [phone]

## 2020-04-03 ENCOUNTER — Other Ambulatory Visit: Payer: Self-pay | Admitting: Family Medicine

## 2020-04-03 DIAGNOSIS — K219 Gastro-esophageal reflux disease without esophagitis: Secondary | ICD-10-CM

## 2020-04-03 NOTE — Telephone Encounter (Signed)
Requested Prescriptions  Pending Prescriptions Disp Refills  . omeprazole (PRILOSEC) 40 MG capsule [Pharmacy Med Name: OMEPRAZOLE DR 40 MG CAPSULE] 30 capsule 0    Sig: TAKE 1 CAPSULE BY MOUTH EVERY DAY     Gastroenterology: Proton Pump Inhibitors Failed - 04/03/2020  9:16 AM      Failed - Valid encounter within last 12 months    Recent Outpatient Visits          1 year ago Exposure to Covid-19 Virus   [facility] [place], Netta Neat, DO   1 year ago BPH with obstruction/lower urinary tract symptoms   [facility] Smitty Cords, DO   1 year ago Influenza   [facility] [place], Netta Neat, DO   1 year ago Gastroesophageal reflux disease without esophagitis   [facility] [place], Netta Neat, DO   2 years ago Gastroesophageal reflux disease without esophagitis   [facility] [place], Netta Neat, [place]

## 2020-04-29 ENCOUNTER — Other Ambulatory Visit: Payer: Self-pay | Admitting: Family Medicine

## 2020-04-29 DIAGNOSIS — K219 Gastro-esophageal reflux disease without esophagitis: Secondary | ICD-10-CM

## 2020-04-29 NOTE — Telephone Encounter (Signed)
Requested medication (s) are due for refill today:  Yes  Requested medication (s) are on the active medication list:  Yes  Future visit scheduled:  No  Last Refill: 04/03/20; #30; no refills  Notes to clinic:  pt. Is overdue for f/u office appt..  pt. Was contacted.  He stated he does not have any reason to schedule an appt. At this time.  Stated "I'm not having any problems, and don't see the need to come in, to get a refill on a medication that I can buy over the counter." Pt. Stated he prefers not to schedule an appt. At this time.  Advised nurse will send this refill request to Dr. Kirtland Bouchard, for his review.  The pt. Stated, "if he needs me to come in, I will have to do it after the holidays, because I am getting ready to go on a 3 week mission trip."   Requested Prescriptions  Pending Prescriptions Disp Refills   omeprazole (PRILOSEC) 40 MG capsule [Pharmacy Med Name: OMEPRAZOLE DR 40 MG CAPSULE] 30 capsule 0    Sig: TAKE 1 CAPSULE BY MOUTH EVERY DAY      Gastroenterology: Proton Pump Inhibitors Failed - 04/29/2020  9:35 AM      Failed - Valid encounter within last 12 months    Recent Outpatient Visits           1 year ago Exposure to Covid-19 Virus   [facility] [place], Netta Neat, DO   1 year ago BPH with obstruction/lower urinary tract symptoms   [facility] Smitty Cords, DO   1 year ago Influenza   [facility] [place], Netta Neat, DO   2 years ago Gastroesophageal reflux disease without esophagitis   [facility] [place], Netta Neat, DO   2 years ago Gastroesophageal reflux disease without esophagitis   [facility] [place], Netta Neat, DO

## 2020-08-06 ENCOUNTER — Other Ambulatory Visit: Payer: Self-pay | Admitting: Family Medicine

## 2020-08-06 DIAGNOSIS — K219 Gastro-esophageal reflux disease without esophagitis: Secondary | ICD-10-CM

## 2020-08-06 NOTE — Telephone Encounter (Signed)
Requested medication (s) are due for refill today:   Yes  Requested medication (s) are on the active medication list:   Yes  Future visit scheduled:   No   LOV 12/29/2018  See note from 04/29/2020 regarding appts.   Last ordered: 04/30/2020 #90, 0 refills  Clinic note:   Returned for Dr. Althea Charon review for refill.   Requested Prescriptions  Pending Prescriptions Disp Refills   omeprazole (PRILOSEC) 40 MG capsule [Pharmacy Med Name: OMEPRAZOLE DR 40 MG CAPSULE] 30 capsule 2    Sig: TAKE 1 CAPSULE BY MOUTH EVERY DAY      Gastroenterology: Proton Pump Inhibitors Failed - 08/06/2020  8:49 AM      Failed - Valid encounter within last 12 months    Recent Outpatient Visits           1 year ago Exposure to Covid-19 Virus   [facility] [place], Netta Neat, DO   1 year ago BPH with obstruction/lower urinary tract symptoms   [facility] Smitty Cords, DO   2 years ago Influenza   [facility] [place], Netta Neat, DO   2 years ago Gastroesophageal reflux disease without esophagitis   [facility] [place], Netta Neat, DO   2 years ago Gastroesophageal reflux disease without esophagitis   [facility] [place], Netta Neat, DO

## 2020-11-10 ENCOUNTER — Other Ambulatory Visit: Payer: Self-pay | Admitting: Family Medicine

## 2020-11-10 DIAGNOSIS — K219 Gastro-esophageal reflux disease without esophagitis: Secondary | ICD-10-CM

## 2020-11-10 NOTE — Telephone Encounter (Signed)
Requested medications are due for refill today yes  Requested medications are on the active medication list yes  Last refill 4/17  Last visit 12/2018  Future visit scheduled no  Notes to clinic Has already had a curtesy refill and there is no upcoming appointment scheduled.

## 2020-11-25 ENCOUNTER — Other Ambulatory Visit: Payer: Self-pay

## 2020-11-25 ENCOUNTER — Ambulatory Visit (INDEPENDENT_AMBULATORY_CARE_PROVIDER_SITE_OTHER): Payer: 59 | Admitting: Family Medicine

## 2020-11-25 ENCOUNTER — Encounter: Payer: Self-pay | Admitting: Family Medicine

## 2020-11-25 ENCOUNTER — Other Ambulatory Visit: Payer: Self-pay | Admitting: Family Medicine

## 2020-11-25 VITALS — BP 125/88 | HR 64 | Ht 69.0 in | Wt 182.2 lb

## 2020-11-25 DIAGNOSIS — Z0000 Encounter for general adult medical examination without abnormal findings: Secondary | ICD-10-CM

## 2020-11-25 DIAGNOSIS — K219 Gastro-esophageal reflux disease without esophagitis: Secondary | ICD-10-CM

## 2020-11-25 DIAGNOSIS — N401 Enlarged prostate with lower urinary tract symptoms: Secondary | ICD-10-CM

## 2020-11-25 DIAGNOSIS — N529 Male erectile dysfunction, unspecified: Secondary | ICD-10-CM

## 2020-11-25 DIAGNOSIS — Z1211 Encounter for screening for malignant neoplasm of colon: Secondary | ICD-10-CM

## 2020-11-25 DIAGNOSIS — Z6826 Body mass index (BMI) 26.0-26.9, adult: Secondary | ICD-10-CM | POA: Diagnosis not present

## 2020-11-25 MED ORDER — OMEPRAZOLE 40 MG PO CPDR: 40.0000 mg | DELAYED_RELEASE_CAPSULE | Freq: Every day | ORAL | 1 refills | Status: DC

## 2020-11-25 MED ORDER — TADALAFIL 10 MG PO TABS: ORAL_TABLET | ORAL | 3 refills | Status: DC

## 2020-11-25 NOTE — Progress Notes (Signed)
Subjective:    Patient ID: Dennis Keller, male    DOB: 12-May-1966, 55 y.o.   MRN: [id number]  Dennis Keller is a 55 y.o. male presenting on 11/25/2020 for Gastroesophageal Reflux  Last visit 2020.  HPI  GERD Chronic problem. Well controlled on Omeprazole 40mg , original problem was back in 2019 as documented, h pylori was negative, further management on PPI high dose and sucralfate. He has done well since, now needs re authorized refills of Omeprazole 40mg , if misses dose for 1-2 days can have recurrence of heartburn symptoms, otherwise it controls symptoms while on medication.  Allergic Rhinosinusitis He had recent travel to 2020 and high temp over there, said it triggered his allergies and sinuses, he had mucus that resolved w/ mucinex. The drainage has resolved and then he started allergy medication for dry cough. It has helped. He is still able to preach.  BPH LUTS ED He has some symptoms LUTS. Followed by Urology previously last in 2021, now can follow here. He has some ED symptoms. Improved on Tadalafil takes it PRN   History COVID positive 05/2020, after mission trip to [place]22. Mild course, has resolved.  Health Maintenance:  Colon CA Screening: Never had colonoscopy. Currently asymptomatic. No known family history of colon CA. Due for screening test new referral to GI for colonoscopy   UTD COVID Vaccine and booster.  PSA was 1.9 last time 2020, we can repeat here. He has some fam history of prostate cancer.   Depression screen [facility] 2/9 11/25/2020 12/29/2018 11/24/2018  Decreased Interest 0 0 0  Down, Depressed, Hopeless 0 0 0  PHQ - 2 Score 0 0 0  Altered sleeping 0 - -  Tired, decreased energy 0 - -  Change in appetite 0 - -  Feeling bad or failure about yourself  0 - -  Trouble concentrating 0 - -  Moving slowly or fidgety/restless 0 - -  Suicidal thoughts 0 - -  PHQ-9 Score 0 - -  Difficult doing work/chores Not difficult at all - -    Social History    Tobacco Use  . Smoking status: Never Smoker  . Smokeless tobacco: Former 03/01/2019    Types: Chew  . Tobacco comment: Smokeless tobacco for 10 years, quit 2007  Vaping Use  . Vaping Use: Never used  Substance Use Topics  . Alcohol use: Never  . Drug use: Never    Review of Systems Per HPI unless specifically indicated above     Objective:    BP 125/88   Pulse 64   Ht 5\' 9"  (1.753 m)   Wt 182 lb 3.2 oz (82.6 kg)   SpO2 99%   BMI 26.91 kg/m   Wt Readings from Last 3 Encounters:  11/25/20 182 lb 3.2 oz (82.6 kg)  01/07/20 177 lb (80.3 kg)  01/06/19 183 lb 14.4 oz (83.4 kg)    Physical Exam Vitals and nursing note reviewed.  Constitutional:      General: He is not in acute distress.    Appearance: He is well-developed. He is not diaphoretic.     Comments: Well-appearing, comfortable, cooperative  HENT:     Head: Normocephalic and atraumatic.  Eyes:     General:        Right eye: No discharge.        Left eye: No discharge.     Conjunctiva/sclera: Conjunctivae normal.  Neck:     Thyroid: No thyromegaly.  Cardiovascular:     Rate and  Rhythm: Normal rate and regular rhythm.     Heart sounds: Normal heart sounds. No murmur heard.   Pulmonary:     Effort: Pulmonary effort is normal. No respiratory distress.     Breath sounds: Normal breath sounds. No wheezing or rales.  Musculoskeletal:        General: Normal range of motion.     Cervical back: Normal range of motion and neck supple.  Lymphadenopathy:     Cervical: No cervical adenopathy.  Skin:    General: Skin is warm and dry.     Findings: No erythema or rash.  Neurological:     Mental Status: He is alert and oriented to person, place, and time.  Psychiatric:        Behavior: Behavior normal.     Comments: Well groomed, good eye contact, normal speech and thoughts    Results for orders placed or performed in visit on 01/06/19  PSA  Result Value Ref Range   Prostate Specific Ag, Serum 1.9 0.0 - 4.0 ng/mL       Assessment & Plan:   Problem List Items Addressed This Visit    Gastroesophageal reflux disease without esophagitis - Primary   Relevant Medications   omeprazole (PRILOSEC) 40 MG capsule   Erectile dysfunction   Relevant Medications   tadalafil (CIALIS) 10 MG tablet   Benign prostatic hyperplasia with lower urinary tract symptoms   Relevant Medications   tadalafil (CIALIS) 10 MG tablet    Other Visit Diagnoses    BMI 26.0-26.9,adult       Colon cancer screening       Relevant Orders   Ambulatory referral to Gastroenterology      GERD Controlled chronic problem On PPI high dose Omeprazole 40mg  daily, discussed options to taper dose to 20mg  on otc trial or consider taper off avoid rebound, if needed can continue 40mg  dosage, will refill now  BPH LUTS ED Chronic issues that have remained controlled now improved on Tadalafil PRN dosing. Improves both BPH and ED. Will re order, had urology evaluation previously they have no longer needed to follow him, we can check PSA here, last was negative 1.9, repeat due with labs.  Colon CA Screen Referral in to [facility] 1st routine colonoscopy  Orders Placed This Encounter  Procedures  . Ambulatory referral to Gastroenterology    Referral Priority:   Routine    Referral Type:   Consultation    Referral Reason:   Specialty Services Required    Number of Visits Requested:   1    Meds ordered this encounter  Medications  . omeprazole (PRILOSEC) 40 MG capsule    Sig: Take 1 capsule (40 mg total) by mouth daily.    Dispense:  90 capsule    Refill:  1  . tadalafil (CIALIS) 10 MG tablet    Sig: 1 tab by mouth 1 hour prior to intercourse    Dispense:  30 tablet    Refill:  3      Follow up plan: Return in about 6 weeks (around 01/06/2021) for 6 week fasting lab only then 1 week later Annual Physical.  Future labs ordered for 12/2020  , DO [facility] 11/25/2020, 3:44 PM

## 2020-11-25 NOTE — Patient Instructions (Addendum)
Thank you for coming to the office today.  Referral to GI for colonoscopy  [facility]) [address] [place], [place] [address] Main: [phone]  -----------  DUE for FASTING BLOOD WORK (no food or drink after midnight before the lab appointment, only water or coffee without cream/sugar on the morning of)  SCHEDULE "Lab Only" visit in the morning at the clinic for lab draw in 6 WEEKS   - Make sure Lab Only appointment is at about 1 week before your next appointment, so that results will be available  For Lab Results, once available within 2-3 days of blood draw, you can can log in to MyChart online to view your results and a brief explanation. Also, we can discuss results at next follow-up visit.   Please schedule a Follow-up Appointment to: Return in about 6 weeks (around 01/06/2021) for 6 week fasting lab only then 1 week later Annual Physical.  If you have any other questions or concerns, please feel free to call the office or send a message through MyChart. You may also schedule an earlier appointment if necessary.  Additionally, you may be receiving a survey about your experience at our office within a few days to 1 week by e-mail or mail. We value your feedback.  [name], DO [facility], [place]

## 2020-12-07 ENCOUNTER — Telehealth (INDEPENDENT_AMBULATORY_CARE_PROVIDER_SITE_OTHER): Payer: Self-pay | Admitting: Gastroenterology

## 2020-12-07 DIAGNOSIS — Z1211 Encounter for screening for malignant neoplasm of colon: Secondary | ICD-10-CM

## 2020-12-07 MED ORDER — CLENPIQ 10-3.5-12 MG-GM -GM/160ML PO SOLN: 1.0000 | Freq: Once | ORAL | 0 refills | Status: AC

## 2020-12-07 NOTE — Progress Notes (Signed)
Gastroenterology Pre-Procedure Review  Request Date: 01/04/21 Requesting Physician: Dr. Vicente Males  PATIENT REVIEW QUESTIONS: The patient responded to the following health history questions as indicated:    1. Are you having any GI issues? no 2. Do you have a personal history of Polyps? no 3. Do you have a family history of Colon Cancer or Polyps? no 4. Diabetes Mellitus? no 5. Joint replacements in the past 12 months?no 6. Major health problems in the past 3 months?no 7. Any artificial heart valves, MVP, or defibrillator?no    MEDICATIONS & ALLERGIES:    Patient reports the following regarding taking any anticoagulation/antiplatelet therapy:   Plavix, Coumadin, Eliquis, Xarelto, Lovenox, Pradaxa, Brilinta, or Effient? no Aspirin? no  Patient confirms/reports the following medications:  Current Outpatient Medications  Medication Sig Dispense Refill   Sod Picosulfate-Mag Ox-Cit Acd (CLENPIQ) 10-3.5-12 MG-GM -GM/160ML SOLN Take 1 kit by mouth once for 1 dose. 320 mL 0   fluticasone (FLONASE) 50 MCG/ACT nasal spray Place 2 sprays into both nostrils daily. Use for 4-6 weeks then stop and use seasonally or as needed. 16 g 3   omeprazole (PRILOSEC) 40 MG capsule Take 1 capsule (40 mg total) by mouth daily. 90 capsule 1   tadalafil (CIALIS) 10 MG tablet 1 tab by mouth 1 hour prior to intercourse 30 tablet 3   No current facility-administered medications for this visit.    Patient confirms/reports the following allergies:  Allergies  Allergen Reactions   Penicillin G Other (See Comments)    No orders of the defined types were placed in this encounter.   AUTHORIZATION INFORMATION Primary Insurance: 1D#: Group #:  Secondary Insurance: 1D#: Group #:  SCHEDULE INFORMATION: Date: 01/04/21 Time: Location: [place]

## 2020-12-09 ENCOUNTER — Telehealth: Payer: Self-pay | Admitting: Gastroenterology

## 2020-12-09 NOTE — Telephone Encounter (Signed)
Cancel procedure for now per patient request

## 2020-12-09 NOTE — Telephone Encounter (Signed)
Called the endoscopy unit and spoke to Trish and cancelled his procedure per patient's request.

## 2021-01-04 ENCOUNTER — Encounter: Admission: RE | Payer: Self-pay | Source: Home / Self Care

## 2021-01-04 ENCOUNTER — Ambulatory Visit: Admission: RE | Admit: 2021-01-04 | Payer: 59 | Source: Home / Self Care | Admitting: Gastroenterology

## 2021-01-04 SURGERY — COLONOSCOPY WITH PROPOFOL
Anesthesia: General

## 2021-05-26 ENCOUNTER — Other Ambulatory Visit: Payer: Self-pay | Admitting: Family Medicine

## 2021-05-26 DIAGNOSIS — K219 Gastro-esophageal reflux disease without esophagitis: Secondary | ICD-10-CM

## 2021-05-27 NOTE — Telephone Encounter (Signed)
Requested Prescriptions  Pending Prescriptions Disp Refills  . omeprazole (PRILOSEC) 40 MG capsule [Pharmacy Med Name: OMEPRAZOLE DR 40 MG CAPSULE] 30 capsule 5    Sig: TAKE 1 CAPSULE (40 MG TOTAL) BY MOUTH DAILY.     Gastroenterology: Proton Pump Inhibitors Passed - 05/26/2021  1:27 AM      Passed - Valid encounter within last 12 months    Recent Outpatient Visits          6 months ago Gastroesophageal reflux disease without esophagitis   [facility] [place], Netta Neat, DO   2 years ago Exposure to [employer] Virus   [facility], Netta Neat, DO   2 years ago BPH with obstruction/lower urinary tract symptoms   [facility] Smitty Cords, DO   2 years ago Influenza   [facility] [place], Netta Neat, DO   3 years ago Gastroesophageal reflux disease without esophagitis   [facility] [place], Netta Neat, [place]

## 2021-12-10 ENCOUNTER — Other Ambulatory Visit: Payer: Self-pay | Admitting: Family Medicine

## 2021-12-10 DIAGNOSIS — K219 Gastro-esophageal reflux disease without esophagitis: Secondary | ICD-10-CM

## 2021-12-11 ENCOUNTER — Encounter: Payer: Self-pay | Admitting: *Deleted

## 2021-12-11 NOTE — Telephone Encounter (Signed)
Message left for need of scheduling appt.

## 2021-12-11 NOTE — Telephone Encounter (Signed)
Requested Prescriptions  Pending Prescriptions Disp Refills  . omeprazole (PRILOSEC) 40 MG capsule [Pharmacy Med Name: OMEPRAZOLE DR 40 MG CAPSULE] 30 capsule 2    Sig: TAKE 1 CAPSULE (40 MG TOTAL) BY MOUTH DAILY.     Gastroenterology: Proton Pump Inhibitors Failed - 12/10/2021  9:18 AM      Failed - Valid encounter within last 12 months    Recent Outpatient Visits          1 year ago Gastroesophageal reflux disease without esophagitis   [facility] [place], Netta Neat, DO   2 years ago Exposure to [employer] Virus   [facility] [place], Netta Neat, DO   3 years ago BPH with obstruction/lower urinary tract symptoms   [facility] Smitty Cords, DO   3 years ago Influenza   [facility] [place], Netta Neat, DO   3 years ago Gastroesophageal reflux disease without esophagitis   [facility] [place], Netta Neat, DO

## 2021-12-18 ENCOUNTER — Encounter: Payer: Self-pay | Admitting: Family Medicine

## 2021-12-18 ENCOUNTER — Ambulatory Visit: Payer: Self-pay | Admitting: Family Medicine

## 2021-12-18 VITALS — BP 113/77 | HR 67 | Ht 69.0 in | Wt 183.6 lb

## 2021-12-18 DIAGNOSIS — N401 Enlarged prostate with lower urinary tract symptoms: Secondary | ICD-10-CM

## 2021-12-18 DIAGNOSIS — N529 Male erectile dysfunction, unspecified: Secondary | ICD-10-CM

## 2021-12-18 DIAGNOSIS — Z1211 Encounter for screening for malignant neoplasm of colon: Secondary | ICD-10-CM

## 2021-12-18 DIAGNOSIS — K219 Gastro-esophageal reflux disease without esophagitis: Secondary | ICD-10-CM

## 2021-12-18 MED ORDER — OMEPRAZOLE 40 MG PO CPDR: 40.0000 mg | DELAYED_RELEASE_CAPSULE | Freq: Every day | ORAL | 11 refills | Status: DC

## 2021-12-18 MED ORDER — TADALAFIL 10 MG PO TABS: ORAL_TABLET | ORAL | 5 refills | Status: DC

## 2022-01-04 LAB — COLOGUARD: COLOGUARD: NEGATIVE

## 2022-12-10 ENCOUNTER — Emergency Department
Admission: EM | Admit: 2022-12-10 | Discharge: 2022-12-10 | Disposition: A | Discharge status: Home / Self Care | Payer: No Typology Code available for payment source | Attending: Emergency Medicine | Admitting: Emergency Medicine

## 2022-12-10 ENCOUNTER — Other Ambulatory Visit: Payer: Self-pay

## 2022-12-10 ENCOUNTER — Ambulatory Visit: Payer: Self-pay

## 2022-12-10 DIAGNOSIS — R1013 Epigastric pain: Secondary | ICD-10-CM | POA: Diagnosis present

## 2022-12-10 DIAGNOSIS — D72829 Elevated white blood cell count, unspecified: Secondary | ICD-10-CM | POA: Insufficient documentation

## 2022-12-10 LAB — URINALYSIS, ROUTINE W REFLEX MICROSCOPIC
Bacteria, UA: NONE SEEN
Bilirubin Urine: NEGATIVE
Glucose, UA: NEGATIVE mg/dL
Hgb urine dipstick: NEGATIVE
Ketones, ur: 20 mg/dL — AB
Leukocytes,Ua: NEGATIVE
Nitrite: NEGATIVE
Protein, ur: 100 mg/dL — AB
Specific Gravity, Urine: 1.032 — ABNORMAL HIGH (ref 1.005–1.030)
Squamous Epithelial / HPF: NONE SEEN /HPF (ref 0–5)
pH: 6 (ref 5.0–8.0)

## 2022-12-10 LAB — CBC
HCT: 46.7 % (ref 39.0–52.0)
Hemoglobin: 15.1 g/dL (ref 13.0–17.0)
MCH: 28.3 pg (ref 26.0–34.0)
MCHC: 32.3 g/dL (ref 30.0–36.0)
MCV: 87.5 fL (ref 80.0–100.0)
Platelets: 294 10*3/uL (ref 150–400)
RBC: 5.34 MIL/uL (ref 4.22–5.81)
RDW: 12.9 % (ref 11.5–15.5)
WBC: 13.9 10*3/uL — ABNORMAL HIGH (ref 4.0–10.5)
nRBC: 0 % (ref 0.0–0.2)

## 2022-12-10 LAB — COMPREHENSIVE METABOLIC PANEL
ALT: 32 U/L (ref 0–44)
AST: 31 U/L (ref 15–41)
Albumin: 4.5 g/dL (ref 3.5–5.0)
Alkaline Phosphatase: 46 U/L (ref 38–126)
Anion gap: 11 (ref 5–15)
BUN: 15 mg/dL (ref 6–20)
CO2: 24 mmol/L (ref 22–32)
Calcium: 9.3 mg/dL (ref 8.9–10.3)
Chloride: 103 mmol/L (ref 98–111)
Creatinine, Ser: 0.89 mg/dL (ref 0.61–1.24)
GFR, Estimated: >60 mL/min (ref >60)
Glucose, Bld: 146 mg/dL — ABNORMAL HIGH (ref 70–99)
Potassium: 3.9 mmol/L (ref 3.5–5.1)
Sodium: 138 mmol/L (ref 135–145)
Total Bilirubin: 0.9 mg/dL (ref 0.3–1.2)
Total Protein: 8.2 g/dL — ABNORMAL HIGH (ref 6.5–8.1)

## 2022-12-10 LAB — LIPASE, BLOOD: Lipase: 31 U/L (ref 11–51)

## 2022-12-10 LAB — TROPONIN I (HIGH SENSITIVITY): Troponin I (High Sensitivity): 4 ng/L (ref <18)

## 2022-12-10 MED ORDER — SUCRALFATE 1 G PO TABS: 1.0000 g | ORAL_TABLET | Freq: Three times a day (TID) | ORAL | 0 refills | Status: AC | PRN

## 2022-12-10 MED ORDER — PANTOPRAZOLE SODIUM 40 MG IV SOLR
40.0000 mg | Freq: Once | INTRAVENOUS | Status: AC
  Administered 2022-12-10: 40 mg via INTRAVENOUS
  Filled 2022-12-10: qty 10

## 2022-12-10 MED ORDER — ONDANSETRON HCL 4 MG/2ML IJ SOLN
4.0000 mg | Freq: Once | INTRAMUSCULAR | Status: AC
  Administered 2022-12-10: 4 mg via INTRAVENOUS
  Filled 2022-12-10: qty 2

## 2022-12-10 MED ORDER — HYDROCODONE-ACETAMINOPHEN 5-325 MG PO TABS: 1.0000 | ORAL_TABLET | ORAL | 0 refills | Status: DC | PRN

## 2022-12-10 MED ORDER — SUCRALFATE 1 G PO TABS
1.0000 g | ORAL_TABLET | Freq: Once | ORAL | Status: AC
  Administered 2022-12-10: 1 g via ORAL
  Filled 2022-12-10: qty 1

## 2022-12-10 MED ORDER — SODIUM CHLORIDE 0.9 % IV BOLUS
1000.0000 mL | Freq: Once | INTRAVENOUS | Status: AC
  Administered 2022-12-10: 1000 mL via INTRAVENOUS

## 2022-12-10 MED ORDER — ALUM & MAG HYDROXIDE-SIMETH 200-200-20 MG/5ML PO SUSP
30.0000 mL | Freq: Once | ORAL | Status: AC
  Administered 2022-12-10: 30 mL via ORAL
  Filled 2022-12-10: qty 30

## 2022-12-10 MED ORDER — HYDROCODONE-ACETAMINOPHEN 5-325 MG PO TABS
1.0000 | ORAL_TABLET | Freq: Once | ORAL | Status: AC
  Administered 2022-12-10: 1 via ORAL
  Filled 2022-12-10: qty 1

## 2022-12-10 NOTE — ED Provider Notes (Signed)
Patient reassessed.  Well-appearing no acute distress.  History and exam does seem consistent with gastritis.  Troponin negative EKG nonischemic.  Patient tolerating p.o.  Does appear appropriate for outpatient follow-up.   Willy Eddy, MD 12/10/22 [phone]

## 2022-12-10 NOTE — Telephone Encounter (Signed)
  Chief Complaint: upper abd pain Symptoms: moderate to severe constant abd pain, vomiting, gas, feels may be dehydrated  Frequency: last night Pertinent Negatives: Patient denies Chest pain, radiating pain neck or upper back pain, green bile emesis, fever Disposition: [x] ED /[] Urgent Care (no appt availability in office) / [] Appointment(In office/virtual)/ []  [facility]/ [] Home Care/ [] Refused Recommended Disposition /[] [place] [facility]/ []  Follow-up with PCP Additional Notes: plans to go Reason for Disposition  [1] SEVERE pain (e.g., excruciating) AND [2] present > 1 hour  Answer Assessment - Initial Assessment Questions 1. LOCATION: "Where does it hurt?"      Upper stomach  2. RADIATION: "Does the pain shoot anywhere else?" (e.g., chest, back)    Stomach 3. ONSET: "When did the pain begin?" (Minutes, hours or days ago)      Last night  4. SUDDEN: "Gradual or sudden onset?"     Gradual ate fries and cheeseburger 5. PATTERN "Does the pain come and go, or is it constant?"    - If it comes and goes: "How long does it last?" "Do you have pain now?"     (Note: Comes and goes means the pain is intermittent. It goes away completely between bouts.)    - If constant: "Is it getting better, staying the same, or getting worse?"      (Note: Constant means the pain never goes away completely; most serious pain is constant and gets worse.)      Constant  6. SEVERITY: "How bad is the pain?"  (e.g., Scale 1-10; mild, moderate, or severe)    - MILD (1-3): Doesn't interfere with normal activities, abdomen soft and not tender to touch.     - MODERATE (4-7): Interferes with normal activities or awakens from sleep, abdomen tender to touch.     - SEVERE (8-10): Excruciating pain, doubled over, unable to do any normal activities.       severe 7. RECURRENT SYMPTOM: "Have you ever had this type of stomach pain before?" If Yes, ask: "When was the last time?" and "What happened that time?"       No  8. CAUSE: "What do you think is causing the stomach pain?"     Food  9. RELIEVING/AGGRAVATING FACTORS: "What makes it better or worse?" (e.g., antacids, bending or twisting motion, bowel movement)     Better: sitting in warm water  10. OTHER SYMPTOMS: "Do you have any other symptoms?" (e.g., back pain, diarrhea, fever, urination pain, vomiting)       Vomiting, burp x 2  Protocols used: Abdominal Pain - Male-A-AH

## 2022-12-10 NOTE — ED Notes (Signed)
Pt given crackers and water. 

## 2022-12-10 NOTE — ED Provider Notes (Signed)
[facility] Provider Note    Event Date/Time   First MD Initiated Contact with Patient 12/10/22 1423     (approximate)   History   Abdominal Pain   HPI  Dennis Keller is a 57 y.o. male with history of GERD presenting to the emergency department for evaluation of abdominal pain.  Last night, patient had onset of epigastric abdominal pain continuing into today.  Has tried some over-the-counter medicine without significant benefit.  Did have 3 episodes of nonbloody vomiting today.  Has been having regular bowel movements.  No history of abdominal surgery.  Has been trying to balsamic vinegar and lemon juice to see if that helps with symptoms.  Does continue to take Protonix.  Occasional NSAID use.     Physical Exam   Triage Vital Signs: ED Triage Vitals  Enc Vitals Group     BP 12/10/22 1250 (!) 147/85     Pulse Rate 12/10/22 1250 77     Resp 12/10/22 1250 18     Temp 12/10/22 1250 98.7 F (37.1 C)     Temp Source 12/10/22 1250 Oral     SpO2 12/10/22 1250 99 %     Weight 12/10/22 1250 180 lb (81.6 kg)     Height 12/10/22 1352 5\' 9"  (1.753 m)     Head Circumference --      Peak Flow --      Pain Score 12/10/22 1249 7     Pain Loc --      Pain Edu? --      Excl. in GC? --     Most recent vital signs: Vitals:   12/10/22 1250 12/10/22 1427  BP: (!) 147/85 (!) 139/93  Pulse: 77 72  Resp: 18 16  Temp: 98.7 F (37.1 C)   SpO2: 99% 99%     General: Awake, interactive  CV:  Regular rate, good peripheral perfusion.  Resp:  Lungs clear, unlabored respirations.  Abd:  Soft, nondistended, mild tenderness to palpation in the epigastric region, remainder of abdomen nontender, no rebound or guarding Neuro:  Symmetric facial movement, fluid speech   ED Results / Procedures / Treatments   Labs (all labs ordered are listed, but only abnormal results are displayed) Labs Reviewed  COMPREHENSIVE METABOLIC PANEL - Abnormal; Notable for the following  components:      Result Value   Glucose, Bld 146 (*)    Total Protein 8.2 (*)    All other components within normal limits  CBC - Abnormal; Notable for the following components:   WBC 13.9 (*)    All other components within normal limits  URINALYSIS, ROUTINE W REFLEX MICROSCOPIC - Abnormal; Notable for the following components:   Color, Urine AMBER (*)    APPearance HAZY (*)    Specific Gravity, Urine 1.032 (*)    Ketones, ur 20 (*)    Protein, ur 100 (*)    All other components within normal limits  LIPASE, BLOOD     EKG EKG independently reviewed interpreted by myself (ER attending) demonstrates:    RADIOLOGY Imaging independently reviewed and interpreted by myself demonstrates:    PROCEDURES:  Critical Care performed: No  Procedures   MEDICATIONS ORDERED IN ED: Medications  sodium chloride 0.9 % bolus 1,000 mL (1,000 mLs Intravenous New Bag/Given 12/10/22 1452)  ondansetron (ZOFRAN) injection 4 mg (4 mg Intravenous Given 12/10/22 1448)  HYDROcodone-acetaminophen (NORCO/VICODIN) 5-325 MG per tablet 1 tablet (1 tablet Oral Given 12/10/22 1440)  alum &  mag hydroxide-simeth (MAALOX/MYLANTA) 200-200-20 MG/5ML suspension 30 mL (30 mLs Oral Given 12/10/22 1440)     IMPRESSION / MDM / ASSESSMENT AND PLAN / ED COURSE  I reviewed the triage vital signs and the nursing notes.  Differential diagnosis includes, but is not limited to, gastritis, peptic ulcer disease, GI bleed, pancreatitis, lower suspicion other acute intra-abdominal process given overall reassuring exam  Patient's presentation is most consistent with acute presentation with potential threat to life or bodily function.  57 year old male presenting with epigastric pain.  Abdominal exam is overall reassuring.  Lab work obtained with mild leukocytosis with WBC of 13.9, normal hemoglobin.Overall low suspicion for significant acute intra-abdominal process.  Will trial symptomatic treatment with GI cocktail, Norco,  Zofran and plan for p.o. challenge.  If symptoms are improved, suspect patient may be able to be discharged with outpatient follow-up.  At 1515, care of this patient signed out oncoming provider pending reassessment after medication.       FINAL CLINICAL IMPRESSION(S) / ED DIAGNOSES   Final diagnoses:  Epigastric pain     Rx / DC Orders   ED Discharge Orders     None        Note:  This document was prepared using Dragon voice recognition software and may include unintentional dictation errors.   Trinna Post, MD 12/10/22 [phone]

## 2022-12-10 NOTE — Discharge Instructions (Addendum)
I recommend you start taking your Protonix twice daily for the next 4 to 5 days.  Please Call GI clinic for follow up as I believe  you will benefit from an Endoscopy.  You have been seen in the emergency department for emergency care. It is important that you contact your own doctor, specialist or the closest clinic for follow-up care. Please bring this instruction sheet, all medications and X-ray copies with you when you are seen for follow-up care.  Determining the exact cause for all patients with abdominal pain is extremely difficult in the emergency department. Our primary focus is to rule-out immediate life-threatening diseases. If no immediate source of pain is found the definitive diagnosis frequently needs to be determined over time.Many times your primary care physician can determine the cause by following the symptoms over time. Sometimes, specialist are required such as Gastroenterologists, Gynecologists, Urologists or Surgeons. Please return immediately to the Emergency Department for fever>101, Vomiting or Intractable Pain. You should return to the emergency department or see your primary care provider in 12-24hrs if your pain is no better and sooner if your pain becomes worse.

## 2022-12-10 NOTE — ED Triage Notes (Signed)
Pt sts that he has been having abd pain since last night. Pt sts that he has been having nausea and vomiting. Pt sts that he has gastric reflux and he said that is what it feels like but it has gotten worse.

## 2022-12-10 NOTE — Telephone Encounter (Signed)
FYI

## 2022-12-12 ENCOUNTER — Encounter: Payer: Self-pay | Admitting: Gastroenterology

## 2022-12-12 ENCOUNTER — Ambulatory Visit (INDEPENDENT_AMBULATORY_CARE_PROVIDER_SITE_OTHER): Payer: No Typology Code available for payment source | Admitting: Gastroenterology

## 2022-12-12 ENCOUNTER — Telehealth: Payer: Self-pay | Admitting: Family Medicine

## 2022-12-12 ENCOUNTER — Other Ambulatory Visit: Payer: Self-pay

## 2022-12-12 VITALS — BP 119/80 | HR 111 | Temp 98.7°F | Ht 69.0 in | Wt 184.0 lb

## 2022-12-12 DIAGNOSIS — R109 Unspecified abdominal pain: Secondary | ICD-10-CM

## 2022-12-12 NOTE — Telephone Encounter (Signed)
Patient seen in hospital and by GI. They have concerns about his hydration and I looked at his labs he has glucose in the 140s, he has not been tested w/ A1c in years. He may have hyperglycemia / pre diabetes / diabetes making him dehydrated.  He has only seen me once or twice in past 2-4 years for acute visits it seems.  Please schedule follow up with me. It is okay to do AM apt non fasting, and we can do labs after we talk.  [name], DO [facility] 12/12/2022, 6:14 PM

## 2022-12-12 NOTE — Progress Notes (Signed)
Dennis Mood MD, MRCP(U.K) [address]  [address]  [place], [place] [address]  Main: [phone]  Fax: [phone]   Gastroenterology Consultation  Referring Provider:     Saralyn Keller * Primary Care Physician:  Dennis Cords, DO Primary Gastroenterologist:  Dr. Wyline Keller  Reason for Consultation:     Abdominal pain        HPI:   Dennis Keller is a 57 y.o. y/o male referred for consultation & management  by Dr. Althea Keller, Dennis Neat, DO.   Seen on 12/10/2022 for abdominal pain in the emergency room in the ER he did not have any new imagine.  He was treated conservatively with GI cocktail and discharged. Hemoglobin was 15 g creatinine 0.89, lipase normal. In the ER his EKG showed a heart rate of 65 bpm He states that she has had abdominal pain in the epigastric area going on for a few weeks.  No heartburn.  The pain is sharp in nature nonradiating.  He is not able to eat or drink much.  He did try eating or drinking and caused him to throw up.  Denies any NSAID use.  No change in bowel habits.  He has had an increase in the dosage of his PPI to twice a day omeprazole 40 mg in addition has been taking hydrocodone as well as Carafate.  This has helped to a degree with the pain.   Past Medical History:  Diagnosis Date   BPH (benign prostatic hyperplasia)    ED (erectile dysfunction)    Kidney stone     Past Surgical History:  Procedure Laterality Date   TONSILLECTOMY      Prior to Admission medications   Medication Sig Start Date End Date Taking? Authorizing Provider  fluticasone (FLONASE) 50 MCG/ACT nasal spray Place 2 sprays into both nostrils daily. Use for 4-6 weeks then stop and use seasonally or as needed. 04/17/18   Dennis Keller, Dennis Neat, DO  HYDROcodone-acetaminophen (NORCO) 5-325 MG tablet Take 1 tablet by mouth every 4 (four) hours as needed for moderate pain. 12/10/22   Dennis Eddy, MD  omeprazole (PRILOSEC) 40 MG capsule Take  1 capsule (40 mg total) by mouth daily. 12/18/21   Dennis Keller, Dennis Neat, DO  sucralfate (CARAFATE) 1 g tablet Take 1 tablet (1 g total) by mouth 3 (three) times daily as needed. 12/10/22 12/10/23  Dennis Eddy, MD  tadalafil (CIALIS) 10 MG tablet 1 tab by mouth 1 hour prior to intercourse 12/18/21   Dennis Cords, DO    Family History  Problem Relation Age of Onset   Diabetes Mother    Thyroid disease Mother    Cancer Father 42       throat, smoker   Stroke Father    Prostate cancer Neg Hx    Colon cancer Neg Hx      Social History   Tobacco Use   Smoking status: Never   Smokeless tobacco: Former    Types: Chew    Quit date: 2007   Tobacco comments:    Smokeless tobacco for 10 years, quit 2007  Vaping Use   Vaping Use: Never used  Substance Use Topics   Alcohol use: Never   Drug use: Never    Allergies as of 12/12/2022 - Review Complete 12/12/2022  Allergen Reaction Noted   Penicillin g Other (See Comments) 08/12/2015    Review of Systems:    All systems reviewed and negative except where noted in HPI.  Physical Exam:  BP 119/80   Pulse (!) 111   Temp 98.7 F (37.1 C) (Oral)   Ht 5\' 9"  (1.753 m)   Wt 184 lb (83.5 kg)   BMI 27.17 kg/m  No LMP for male patient. Psych:  Alert and cooperative. Normal Keller and affect. General:   Alert,  Well-developed, well-nourished, pleasant and cooperative in NAD Head:  Normocephalic and atraumatic. Eyes:  Sclera clear, no icterus.   Conjunctiva pink. Ears:  Normal auditory acuity. Neck:  Supple; no masses or thyromegaly. Abdomen:  Normal bowel sounds.  No bruits.  Soft, non-tender and non-distended without masses, hepatosplenomegaly.  Reducible umbilical hernia no guarding or rebound tenderness.    Neurologic:  Alert and oriented x3;  grossly normal neurologically. Psych:  Alert and cooperative. Normal Keller and affect.  Imaging Studies: No results found.  Assessment and Plan:   Dennis Keller is a 57  y.o. y/o male has been referred for abdominal pain.  Seen at the ER recently no imaging performed.  It is limiting his oral intake.  He is feeling a bit better after commencing on a PPI and Carafate.  Plan 1.  EGD 2.  CT scan of the abdomen-urgent 3.  Continue omeprazole 40 mg twice daily and Carafate 4.  H. pylori breath test 5.  Recheck pulse and heart rate if tachycardic will need to go to the ER to get evaluated and possibly get IV fluids if it is related to dehydration as he is not able to eat or drink much recently   I have discussed alternative options, risks & benefits,  which include, but are not limited to, bleeding, infection, perforation,respiratory complication & drug reaction.  The patient agrees with this plan & written consent will be obtained.    Follow up in 6 weeks  Dr Dennis Mood MD,MRCP(U.K)    When he came in he was tachycardic with a heart rate of 144 we rechecked it twice the heart rate was better at the time he left the clinic at 95 bpm he appears to be dehydrated clinically with his tongue appearing dry I advised him to continue to drink oral fluids he has been drinking Gatorade.

## 2022-12-13 ENCOUNTER — Telehealth: Payer: Self-pay

## 2022-12-13 NOTE — Telephone Encounter (Signed)
Spoke with patient and he refuses appointment at this time.

## 2022-12-13 NOTE — Transitions of Care (Post Inpatient/ED Visit) (Signed)
   12/13/2022  Name: Dennis Keller MRN: [id number] DOB: January 16, 1966  Today's TOC FU Call Status: Today's TOC FU Call Status:: Successful TOC FU Call Competed TOC FU Call Complete Date: 12/13/22  Transition Care Management Follow-up Telephone Call Date of Discharge: 12/10/22 Discharge Facility: [facility]) Type of Discharge: Emergency Department How have you been since you were released from the hospital?: Better Any questions or concerns?: No  Items Reviewed: Did you receive and understand the discharge instructions provided?: Yes Medications obtained,verified, and reconciled?: Yes (Medications Reviewed) Any new allergies since your discharge?: No Dietary orders reviewed?: NA  Medications Reviewed Today: Medications Reviewed Today     Reviewed by Adela Ports, CMA (Certified Medical Assistant) on 12/12/22 at 1014  Med List Status: <None>   Medication Order Taking? Sig Documenting Provider Last Dose Status Informant  fluticasone (FLONASE) 50 MCG/ACT nasal spray [id number] Yes Place 2 sprays into both nostrils daily. Use for 4-6 weeks then stop and use seasonally or as needed. Smitty Cords, DO Taking Active   HYDROcodone-acetaminophen (NORCO) 5-325 MG tablet [id number] No Take 1 tablet by mouth every 4 (four) hours as needed for moderate pain.  Patient not taking: Reported on 12/12/2022   Willy Eddy, MD Not Taking Active   omeprazole (PRILOSEC) 40 MG capsule [id number] Yes Take 1 capsule (40 mg total) by mouth daily. Smitty Cords, DO Taking Active   sucralfate (CARAFATE) 1 g tablet [id number] Yes Take 1 tablet (1 g total) by mouth 3 (three) times daily as needed. Willy Eddy, MD Taking Active   tadalafil (CIALIS) 10 MG tablet [id number] Yes 1 tab by mouth 1 hour prior to intercourse Smitty Cords, DO Taking Active             Home Care and Equipment/Supplies: Were Home Health Services Ordered?: NA Any  new equipment or medical supplies ordered?: NA  Functional Questionnaire: Do you need assistance with bathing/showering or dressing?: No Do you need assistance with meal preparation?: No Do you need assistance with eating?: No Do you have difficulty maintaining continence: No Do you need assistance with getting out of bed/getting out of a chair/moving?: No Do you have difficulty managing or taking your medications?: No  Follow up appointments reviewed: PCP Follow-up appointment confirmed?: No MD Provider Line Number:[phone] Given: No Specialist Hospital Follow-up appointment confirmed?: Yes Date of Specialist follow-up appointment?: 12/12/22 Follow-Up Specialty Provider:: [facility] Do you need transportation to your follow-up appointment?: No Do you understand care options if your condition(s) worsen?: Yes-patient verbalized understanding   Oneal Grout, [facility]) Lovie Macadamia [phone]

## 2022-12-14 LAB — H. PYLORI BREATH TEST: H pylori Breath Test: NEGATIVE

## 2022-12-18 ENCOUNTER — Telehealth: Payer: Self-pay | Admitting: Gastroenterology

## 2022-12-18 ENCOUNTER — Observation Stay
Admission: EM | Admit: 2022-12-18 | Discharge: 2022-12-20 | Disposition: A | Discharge status: Home / Self Care | Payer: No Typology Code available for payment source | Attending: Surgery | Admitting: Surgery

## 2022-12-18 ENCOUNTER — Other Ambulatory Visit: Payer: Self-pay

## 2022-12-18 ENCOUNTER — Emergency Department: Payer: No Typology Code available for payment source

## 2022-12-18 ENCOUNTER — Ambulatory Visit
Admission: RE | Admit: 2022-12-18 | Discharge: 2022-12-18 | Disposition: A | Discharge status: Home / Self Care | Payer: No Typology Code available for payment source | Source: Ambulatory Visit | Attending: Gastroenterology | Admitting: Gastroenterology

## 2022-12-18 ENCOUNTER — Encounter: Payer: Self-pay | Admitting: Emergency Medicine

## 2022-12-18 DIAGNOSIS — K429 Umbilical hernia without obstruction or gangrene: Secondary | ICD-10-CM

## 2022-12-18 DIAGNOSIS — N400 Enlarged prostate without lower urinary tract symptoms: Secondary | ICD-10-CM | POA: Diagnosis present

## 2022-12-18 DIAGNOSIS — K810 Acute cholecystitis: Secondary | ICD-10-CM | POA: Diagnosis not present

## 2022-12-18 DIAGNOSIS — Z87891 Personal history of nicotine dependence: Secondary | ICD-10-CM | POA: Insufficient documentation

## 2022-12-18 DIAGNOSIS — R109 Unspecified abdominal pain: Secondary | ICD-10-CM | POA: Insufficient documentation

## 2022-12-18 DIAGNOSIS — K819 Cholecystitis, unspecified: Secondary | ICD-10-CM | POA: Insufficient documentation

## 2022-12-18 DIAGNOSIS — R1011 Right upper quadrant pain: Secondary | ICD-10-CM | POA: Diagnosis present

## 2022-12-18 LAB — CBC
HCT: 44 % (ref 39.0–52.0)
Hemoglobin: 14.3 g/dL (ref 13.0–17.0)
MCH: 28.7 pg (ref 26.0–34.0)
MCHC: 32.5 g/dL (ref 30.0–36.0)
MCV: 88.2 fL (ref 80.0–100.0)
Platelets: 376 10*3/uL (ref 150–400)
RBC: 4.99 MIL/uL (ref 4.22–5.81)
RDW: 12.2 % (ref 11.5–15.5)
WBC: 8.3 10*3/uL (ref 4.0–10.5)
nRBC: 0 % (ref 0.0–0.2)

## 2022-12-18 LAB — COMPREHENSIVE METABOLIC PANEL
ALT: 91 U/L — ABNORMAL HIGH (ref 0–44)
AST: 46 U/L — ABNORMAL HIGH (ref 15–41)
Albumin: 3.9 g/dL (ref 3.5–5.0)
Alkaline Phosphatase: 98 U/L (ref 38–126)
Anion gap: 9 (ref 5–15)
BUN: 18 mg/dL (ref 6–20)
CO2: 24 mmol/L (ref 22–32)
Calcium: 8.9 mg/dL (ref 8.9–10.3)
Chloride: 101 mmol/L (ref 98–111)
Creatinine, Ser: 0.75 mg/dL (ref 0.61–1.24)
GFR, Estimated: >60 mL/min (ref >60)
Glucose, Bld: 89 mg/dL (ref 70–99)
Potassium: 3.5 mmol/L (ref 3.5–5.1)
Sodium: 134 mmol/L — ABNORMAL LOW (ref 135–145)
Total Bilirubin: 0.6 mg/dL (ref 0.3–1.2)
Total Protein: 8.1 g/dL (ref 6.5–8.1)

## 2022-12-18 LAB — LIPASE, BLOOD: Lipase: 60 U/L — ABNORMAL HIGH (ref 11–51)

## 2022-12-18 MED ORDER — IOHEXOL 300 MG/ML  SOLN
100.0000 mL | Freq: Once | INTRAMUSCULAR | Status: AC | PRN
  Administered 2022-12-18: 100 mL via INTRAVENOUS

## 2022-12-18 MED ORDER — GADOBUTROL 1 MMOL/ML IV SOLN
8.0000 mL | Freq: Once | INTRAVENOUS | Status: AC | PRN
  Administered 2022-12-18: 8 mL via INTRAVENOUS

## 2022-12-18 NOTE — ED Triage Notes (Signed)
Patient to ED via POV sent by Dr after outpatient CT scan today showing acute gallbladder. PT reports needing to get it removed. Currently abd pain 2/10. Denies N/V/D

## 2022-12-18 NOTE — ED Provider Notes (Signed)
[facility] Emergency Department Provider Note     None    (approximate)   History   Abdominal Pain   HPI  Dennis Keller is a 57 y.o. male with a history of GERD, BPH, ED, and kidney stones, presents to the ED after an outpatient CT scan performed today, confirms acute cholecystitis.  Patient would endorse a week and a half of intermittent epigastric abdominal discomfort.  Presented to the ED to have a week and a half prior for acute epigastric abdominal pain. He denies any NV or diarrhea. His pain is well controlled at this time.    Physical Exam   Triage Vital Signs: ED Triage Vitals  Enc Vitals Group     BP 12/18/22 1845 (!) 144/99     Pulse Rate 12/18/22 1845 93     Resp 12/18/22 1845 18     Temp 12/18/22 1845 98.3 F (36.8 C)     Temp Source 12/18/22 1845 Oral     SpO2 12/18/22 1845 98 %     Weight --      Height --      Head Circumference --      Peak Flow --      Pain Score 12/18/22 1846 2     Pain Loc --      Pain Edu? --      Excl. in GC? --     Most recent vital signs: Vitals:   12/18/22 1845  BP: (!) 144/99  Pulse: 93  Resp: 18  Temp: 98.3 F (36.8 C)  SpO2: 98%    General Awake, no distress. NAD HEENT NCAT. PERRL. EOMI. No rhinorrhea. Mucous membranes are moist.  CV:  Good peripheral perfusion.  RESP:  Normal effort.  ABD:  No distention.    ED Results / Procedures / Treatments   Labs (all labs ordered are listed, but only abnormal results are displayed) Labs Reviewed  LIPASE, BLOOD - Abnormal; Notable for the following components:      Result Value   Lipase 60 (*)    All other components within normal limits  COMPREHENSIVE METABOLIC PANEL - Abnormal; Notable for the following components:   Sodium 134 (*)    AST 46 (*)    ALT 91 (*)    All other components within normal limits  CBC     EKG   RADIOLOGY  I personally viewed and evaluated these images as part of my medical decision making, as well  as reviewing the written report by the radiologist.  ED Provider Interpretation: acute cholecystitis  CT ABDOMEN PELVIS W WO CONTRAST  Result Date: 12/18/2022 CLINICAL DATA:  Epigastric abdominal pain. EXAM: CT ABDOMEN AND PELVIS WITHOUT AND WITH CONTRAST TECHNIQUE: Multidetector CT imaging of the abdomen and pelvis was performed following the standard protocol before and following the bolus administration of intravenous contrast. RADIATION DOSE REDUCTION: This exam was performed according to the departmental dose-optimization program which includes automated exposure control, adjustment of the mA and/or kV according to patient size and/or use of iterative reconstruction technique. CONTRAST:  OMNIPAQUE IOHEXOL 300 MG/ML  SOLN COMPARISON:  Noncontrast CT on 11/28/2008 FINDINGS: Lower Chest: No acute findings. Hepatobiliary: No suspicious hepatic masses identified. Noncalcified stone seen in gallbladder neck. Mild diffuse gallbladder wall thickening and pericholecystic soft tissue stranding, consistent with acute cholecystitis. No evidence of biliary ductal dilatation. Pancreas:  No mass or inflammatory changes. Spleen: Within normal limits in size and appearance. Adrenals/Urinary Tract: No suspicious  masses identified. No evidence of urolithiasis or hydronephrosis. Stomach/Bowel: No evidence of obstruction, inflammatory process or abnormal fluid collections. Normal appendix visualized. Vascular/Lymphatic: No pathologically enlarged lymph nodes. No acute vascular findings. Reproductive:  Stable mildly enlarged prostate gland. Other:  Stable small umbilical hernia, which contains only fat. Musculoskeletal:  No suspicious bone lesions identified. IMPRESSION: Findings consistent with acute cholecystitis. No evidence of biliary ductal dilatation. Stable small umbilical hernia, which contains only fat. Stable mildly enlarged prostate. These results will be called to the ordering clinician or representative by  the Radiologist Assistant, and communication documented in the PACS or [employer]. Electronically Signed   By: Danae Orleans M.D.   On: 12/18/2022 17:15     PROCEDURES:  Critical Care performed: No  Procedures   MEDICATIONS ORDERED IN ED: Medications  gadobutrol (GADAVIST) 1 MMOL/ML injection 8 mL (8 mLs Intravenous Contrast Given 12/18/22 2139)     IMPRESSION / MDM / ASSESSMENT AND PLAN / ED COURSE  I reviewed the triage vital signs and the nursing notes.                              Differential diagnosis includes, but is not limited to biliary disease (biliary colic, acute cholecystitis, cholangitis, choledocholithiasis, etc), intrathoracic causes for epigastric abdominal pain including ACS, gastritis, duodenitis, pancreatitis, small bowel or large bowel obstruction, abdominal aortic aneurysm, hernia, and ulcer(s).  Patient's presentation is most consistent with acute presentation with potential threat to life or bodily function.  ----------------------------------------- 9:51 PM on 12/18/2022 ----------------------------------------- S/W Dr. Everlene Farrier: I reviewed the patient case with him including HPI, labs, CT results, and current exam.  He recommends MRCP to evaluate for obstructive cause for the patient's lab abnormalities and acute presentation.  He will defer admission to the hospital service and will consult for surgical intervention pending MRCP results.  Patient to the ED for evaluation of intermittent epigastric abdominal pain with CT confirmation of acute cholecystitis.  Patient presents in no acute distress with minimal pain at this time but no reports of any intractable nausea vomiting.  Outpatient CT did confirm acute cholecystitis without evidence. Patient will be further evaluated with a MRCP. Patient is to follow up with *** as needed or otherwise directed. Patient is given ED precautions to return to the ED for any worsening or new symptoms.   FINAL CLINICAL  IMPRESSION(S) / ED DIAGNOSES   Final diagnoses:  Cholecystitis     Rx / DC Orders   ED Discharge Orders     None        Note:  This document was prepared using Dragon voice recognition software and may include unintentional dictation errors.

## 2022-12-18 NOTE — Telephone Encounter (Signed)
Patient told to go to ED for acute cholecystitis.

## 2022-12-18 NOTE — Telephone Encounter (Signed)
x

## 2022-12-19 ENCOUNTER — Observation Stay: Payer: No Typology Code available for payment source | Admitting: Certified Registered"

## 2022-12-19 ENCOUNTER — Other Ambulatory Visit: Payer: Self-pay

## 2022-12-19 ENCOUNTER — Encounter
Admission: EM | Disposition: A | Discharge status: Home / Self Care | Payer: Self-pay | Source: Home / Self Care | Attending: Emergency Medicine

## 2022-12-19 ENCOUNTER — Encounter: Payer: Self-pay | Admitting: Internal Medicine

## 2022-12-19 DIAGNOSIS — K810 Acute cholecystitis: Secondary | ICD-10-CM

## 2022-12-19 DIAGNOSIS — K429 Umbilical hernia without obstruction or gangrene: Secondary | ICD-10-CM

## 2022-12-19 DIAGNOSIS — K819 Cholecystitis, unspecified: Secondary | ICD-10-CM

## 2022-12-19 HISTORY — PX: UMBILICAL HERNIA REPAIR: SHX196

## 2022-12-19 LAB — HIV ANTIBODY (ROUTINE TESTING W REFLEX): HIV Screen 4th Generation wRfx: NONREACTIVE

## 2022-12-19 SURGERY — CHOLECYSTECTOMY, ROBOT-ASSISTED, LAPAROSCOPIC
Anesthesia: General

## 2022-12-19 MED ORDER — BUPIVACAINE HCL (PF) 0.25 % IJ SOLN
INTRAMUSCULAR | Status: AC
  Filled 2022-12-19: qty 30

## 2022-12-19 MED ORDER — ACETAMINOPHEN 650 MG RE SUPP: 650.0000 mg | Freq: Four times a day (QID) | RECTAL | Status: DC | PRN

## 2022-12-19 MED ORDER — FENTANYL CITRATE (PF) 100 MCG/2ML IJ SOLN
INTRAMUSCULAR | Status: DC | PRN
  Administered 2022-12-19 (×2): 50 ug via INTRAVENOUS

## 2022-12-19 MED ORDER — KETOROLAC TROMETHAMINE 30 MG/ML IJ SOLN
INTRAMUSCULAR | Status: AC
  Filled 2022-12-19: qty 1

## 2022-12-19 MED ORDER — 0.9 % SODIUM CHLORIDE (POUR BTL) OPTIME
TOPICAL | Status: DC | PRN
  Administered 2022-12-19: 150 mL

## 2022-12-19 MED ORDER — MIDAZOLAM HCL 2 MG/2ML IJ SOLN
INTRAMUSCULAR | Status: AC
  Filled 2022-12-19: qty 2

## 2022-12-19 MED ORDER — LACTATED RINGERS IV SOLN: INTRAVENOUS | Status: DC

## 2022-12-19 MED ORDER — FENTANYL CITRATE (PF) 100 MCG/2ML IJ SOLN
INTRAMUSCULAR | Status: AC
  Filled 2022-12-19: qty 2

## 2022-12-19 MED ORDER — CIPROFLOXACIN IN D5W 400 MG/200ML IV SOLN
400.0000 mg | Freq: Two times a day (BID) | INTRAVENOUS | Status: DC
  Administered 2022-12-19 – 2022-12-20 (×3): 400 mg via INTRAVENOUS
  Filled 2022-12-19: qty 200

## 2022-12-19 MED ORDER — OXYCODONE HCL 5 MG PO TABS
5.0000 mg | ORAL_TABLET | Freq: Once | ORAL | Status: AC | PRN
  Administered 2022-12-19: 5 mg via ORAL

## 2022-12-19 MED ORDER — ACETAMINOPHEN 500 MG PO TABS
1000.0000 mg | ORAL_TABLET | Freq: Four times a day (QID) | ORAL | Status: DC
  Administered 2022-12-19 – 2022-12-20 (×2): 1000 mg via ORAL

## 2022-12-19 MED ORDER — MIDAZOLAM HCL 2 MG/2ML IJ SOLN
INTRAMUSCULAR | Status: DC | PRN
  Administered 2022-12-19: 2 mg via INTRAVENOUS

## 2022-12-19 MED ORDER — DEXAMETHASONE SODIUM PHOSPHATE 10 MG/ML IJ SOLN
INTRAMUSCULAR | Status: DC | PRN
  Administered 2022-12-19: 10 mg via INTRAVENOUS

## 2022-12-19 MED ORDER — BUPIVACAINE LIPOSOME 1.3 % IJ SUSP
INTRAMUSCULAR | Status: AC
  Filled 2022-12-19: qty 20

## 2022-12-19 MED ORDER — LACTATED RINGERS IV SOLN: INTRAVENOUS | Status: DC | PRN

## 2022-12-19 MED ORDER — HYDROMORPHONE HCL 1 MG/ML IJ SOLN
INTRAMUSCULAR | Status: DC | PRN
  Administered 2022-12-19: 1 mg via INTRAVENOUS

## 2022-12-19 MED ORDER — ONDANSETRON HCL 4 MG/2ML IJ SOLN: 4.0000 mg | Freq: Four times a day (QID) | INTRAMUSCULAR | Status: DC | PRN

## 2022-12-19 MED ORDER — INDOCYANINE GREEN 25 MG IV SOLR
INTRAVENOUS | Status: AC
  Filled 2022-12-19: qty 10

## 2022-12-19 MED ORDER — LACTATED RINGERS IV SOLN: INTRAVENOUS | Status: AC

## 2022-12-19 MED ORDER — PHENYLEPHRINE 80 MCG/ML (10ML) SYRINGE FOR IV PUSH (FOR BLOOD PRESSURE SUPPORT)
PREFILLED_SYRINGE | INTRAVENOUS | Status: DC | PRN
  Administered 2022-12-19 (×3): 160 ug via INTRAVENOUS

## 2022-12-19 MED ORDER — METRONIDAZOLE 500 MG/100ML IV SOLN
500.0000 mg | Freq: Two times a day (BID) | INTRAVENOUS | Status: DC
  Administered 2022-12-19 – 2022-12-20 (×3): 500 mg via INTRAVENOUS
  Filled 2022-12-19 (×4): qty 100

## 2022-12-19 MED ORDER — ACETAMINOPHEN 500 MG PO TABS
ORAL_TABLET | ORAL | Status: AC
  Filled 2022-12-19: qty 2

## 2022-12-19 MED ORDER — PROPOFOL 10 MG/ML IV BOLUS
INTRAVENOUS | Status: DC | PRN
  Administered 2022-12-19: 160 mg via INTRAVENOUS

## 2022-12-19 MED ORDER — EPINEPHRINE PF 1 MG/ML IJ SOLN
INTRAMUSCULAR | Status: AC
  Filled 2022-12-19: qty 1

## 2022-12-19 MED ORDER — CIPROFLOXACIN IN D5W 400 MG/200ML IV SOLN
INTRAVENOUS | Status: AC
  Filled 2022-12-19: qty 200

## 2022-12-19 MED ORDER — BUPIVACAINE-EPINEPHRINE 0.25% -1:200000 IJ SOLN
INTRAMUSCULAR | Status: DC | PRN
  Administered 2022-12-19: 50 mL

## 2022-12-19 MED ORDER — OXYCODONE HCL 5 MG PO TABS: 5.0000 mg | ORAL_TABLET | ORAL | Status: DC | PRN

## 2022-12-19 MED ORDER — ACETAMINOPHEN 10 MG/ML IV SOLN
INTRAVENOUS | Status: DC | PRN
  Administered 2022-12-19: 1000 mg via INTRAVENOUS

## 2022-12-19 MED ORDER — VASOPRESSIN 20 UNIT/ML IV SOLN
INTRAVENOUS | Status: DC | PRN
  Administered 2022-12-19: 4 [IU] via INTRAVENOUS

## 2022-12-19 MED ORDER — LIDOCAINE HCL (CARDIAC) PF 100 MG/5ML IV SOSY
PREFILLED_SYRINGE | INTRAVENOUS | Status: DC | PRN
  Administered 2022-12-19: 80 mg via INTRAVENOUS

## 2022-12-19 MED ORDER — SUGAMMADEX SODIUM 200 MG/2ML IV SOLN
INTRAVENOUS | Status: DC | PRN
  Administered 2022-12-19: 200 mg via INTRAVENOUS

## 2022-12-19 MED ORDER — EPHEDRINE SULFATE (PRESSORS) 50 MG/ML IJ SOLN
INTRAMUSCULAR | Status: DC | PRN
  Administered 2022-12-19 (×3): 5 mg via INTRAVENOUS

## 2022-12-19 MED ORDER — OXYCODONE HCL 5 MG/5ML PO SOLN: 5.0000 mg | Freq: Once | ORAL | Status: AC | PRN

## 2022-12-19 MED ORDER — HYDROCODONE-ACETAMINOPHEN 5-325 MG PO TABS: 1.0000 | ORAL_TABLET | ORAL | Status: DC | PRN

## 2022-12-19 MED ORDER — HYDROMORPHONE HCL 1 MG/ML IJ SOLN
INTRAMUSCULAR | Status: AC
  Filled 2022-12-19: qty 1

## 2022-12-19 MED ORDER — ACETAMINOPHEN 10 MG/ML IV SOLN
INTRAVENOUS | Status: AC
  Filled 2022-12-19: qty 100

## 2022-12-19 MED ORDER — PHENYLEPHRINE HCL-NACL 20-0.9 MG/250ML-% IV SOLN
INTRAVENOUS | Status: DC | PRN
  Administered 2022-12-19: 25 ug/min via INTRAVENOUS

## 2022-12-19 MED ORDER — INDOCYANINE GREEN 25 MG IV SOLR
2.5000 mg | INTRAVENOUS | Status: AC
  Administered 2022-12-19: 2.5 mg via INTRAVENOUS

## 2022-12-19 MED ORDER — DEXMEDETOMIDINE HCL IN NACL 80 MCG/20ML IV SOLN
INTRAVENOUS | Status: DC | PRN
  Administered 2022-12-19: 8 ug via INTRAVENOUS

## 2022-12-19 MED ORDER — ONDANSETRON HCL 4 MG PO TABS: 4.0000 mg | ORAL_TABLET | Freq: Four times a day (QID) | ORAL | Status: DC | PRN

## 2022-12-19 MED ORDER — OXYCODONE HCL 5 MG PO TABS
ORAL_TABLET | ORAL | Status: AC
  Filled 2022-12-19: qty 1

## 2022-12-19 MED ORDER — MORPHINE SULFATE (PF) 2 MG/ML IV SOLN: 2.0000 mg | INTRAVENOUS | Status: DC | PRN

## 2022-12-19 MED ORDER — GLYCOPYRROLATE 0.2 MG/ML IJ SOLN
INTRAMUSCULAR | Status: DC | PRN
  Administered 2022-12-19: .2 mg via INTRAVENOUS

## 2022-12-19 MED ORDER — ACETAMINOPHEN 325 MG PO TABS: 650.0000 mg | ORAL_TABLET | Freq: Four times a day (QID) | ORAL | Status: DC | PRN

## 2022-12-19 MED ORDER — KETOROLAC TROMETHAMINE 30 MG/ML IJ SOLN
30.0000 mg | Freq: Four times a day (QID) | INTRAMUSCULAR | Status: DC
  Administered 2022-12-19 – 2022-12-20 (×3): 30 mg via INTRAVENOUS

## 2022-12-19 MED ORDER — ROCURONIUM BROMIDE 100 MG/10ML IV SOLN
INTRAVENOUS | Status: DC | PRN
  Administered 2022-12-19: 20 mg via INTRAVENOUS
  Administered 2022-12-19: 60 mg via INTRAVENOUS

## 2022-12-19 MED ORDER — SODIUM CHLORIDE 0.9 % IR SOLN
Status: DC | PRN
  Administered 2022-12-19: 200 mL

## 2022-12-19 MED ORDER — ONDANSETRON HCL 4 MG/2ML IJ SOLN
INTRAMUSCULAR | Status: DC | PRN
  Administered 2022-12-19 (×2): 4 mg via INTRAVENOUS

## 2022-12-19 MED ORDER — FENTANYL CITRATE (PF) 100 MCG/2ML IJ SOLN: 25.0000 ug | INTRAMUSCULAR | Status: DC | PRN

## 2022-12-19 SURGICAL SUPPLY — 48 items
ADH SKN CLS APL DERMABOND .7 (GAUZE/BANDAGES/DRESSINGS) ×1
BULB RESERV EVAC DRAIN JP 100C (MISCELLANEOUS) IMPLANT
CANNULA REDUCER 12-8 DVNC XI (CANNULA) ×1 IMPLANT
CAUTERY HOOK MNPLR 1.6 DVNC XI (INSTRUMENTS) ×1 IMPLANT
CHANNEL ROUND DRAIN + TROCAR (SILICONE) IMPLANT
CLIP LIGATING HEMO O LOK GREEN (MISCELLANEOUS) ×1 IMPLANT
DERMABOND ADVANCED .7 DNX12 (GAUZE/BANDAGES/DRESSINGS) ×1 IMPLANT
DRAPE ARM DVNC X/XI (DISPOSABLE) ×4 IMPLANT
DRAPE COLUMN DVNC XI (DISPOSABLE) ×1 IMPLANT
ELECT CAUTERY BLADE 6.4 (BLADE) ×1 IMPLANT
ELECT REM PT RETURN 9FT ADLT (ELECTROSURGICAL) ×1
ELECTRODE REM PT RTRN 9FT ADLT (ELECTROSURGICAL) ×1 IMPLANT
FORCEPS BPLR R/ABLATION 8 DVNC (INSTRUMENTS) ×1 IMPLANT
FORCEPS PROGRASP DVNC XI (FORCEP) ×1 IMPLANT
GLOVE BIO SURGEON STRL SZ7 (GLOVE) ×2 IMPLANT
GOWN STRL REUS W/ TWL LRG LVL3 (GOWN DISPOSABLE) ×4 IMPLANT
GOWN STRL REUS W/TWL LRG LVL3 (GOWN DISPOSABLE) ×4
IRRIGATION STRYKERFLOW (MISCELLANEOUS) IMPLANT
IRRIGATOR STRYKERFLOW (MISCELLANEOUS) ×1
IV NS IRRIG 3000ML ARTHROMATIC (IV SOLUTION) IMPLANT
KIT PINK PAD W/HEAD ARE REST (MISCELLANEOUS) ×1 IMPLANT
KIT PINK PAD W/HEAD ARM REST (MISCELLANEOUS) ×1 IMPLANT
LABEL OR SOLS (LABEL) ×1 IMPLANT
MANIFOLD NEPTUNE II (INSTRUMENTS) ×1 IMPLANT
NDL HYPO 22X1.5 SAFETY MO (MISCELLANEOUS) ×1 IMPLANT
NEEDLE HYPO 22X1.5 SAFETY MO (MISCELLANEOUS) ×1 IMPLANT
NS IRRIG 500ML POUR BTL (IV SOLUTION) ×1 IMPLANT
OBTURATOR OPTICAL STND 8 DVNC (TROCAR) ×1
OBTURATOR OPTICALSTD 8 DVNC (TROCAR) ×1 IMPLANT
PACK LAP CHOLECYSTECTOMY (MISCELLANEOUS) ×1 IMPLANT
PENCIL SMOKE EVACUATOR (MISCELLANEOUS) ×1 IMPLANT
SEAL UNIV 5-12 XI (MISCELLANEOUS) ×4 IMPLANT
SET TUBE SMOKE EVAC HIGH FLOW (TUBING) ×1 IMPLANT
SOL ELECTROSURG ANTI STICK (MISCELLANEOUS) ×1
SOLUTION ELECTROSURG ANTI STCK (MISCELLANEOUS) ×1 IMPLANT
SPIKE FLUID TRANSFER (MISCELLANEOUS) ×1 IMPLANT
SPONGE DRAIN TRACH 4X4 STRL 2S (GAUZE/BANDAGES/DRESSINGS) IMPLANT
SPONGE T-LAP 18X18 ~~LOC~~+RFID (SPONGE) ×1 IMPLANT
SUT ETHIBOND NAB MO 7 #0 18IN (SUTURE) IMPLANT
SUT ETHILON 3-0 FS-10 30 BLK (SUTURE) ×1
SUT MNCRL AB 4-0 PS2 18 (SUTURE) ×1 IMPLANT
SUT VICRYL 0 UR6 27IN ABS (SUTURE) ×2 IMPLANT
SUTURE EHLN 3-0 FS-10 30 BLK (SUTURE) IMPLANT
SYR 20ML LL LF (SYRINGE) IMPLANT
SYS BAG RETRIEVAL 10MM (BASKET) ×1
SYSTEM BAG RETRIEVAL 10MM (BASKET) ×1 IMPLANT
TAPE CLOTH SOFT 2X10 (GAUZE/BANDAGES/DRESSINGS) IMPLANT
WATER STERILE IRR 500ML POUR (IV SOLUTION) ×1 IMPLANT

## 2022-12-19 NOTE — Discharge Instructions (Signed)
In addition to included general post-operative instructions,  Diet: Resume home diet. Recommend avoiding or limiting fatty/greasy foods over the next few days/week. If you do eat these, you may (or may not) notice diarrhea. This is expected while your body adjusts to not having a gallbladder, and it typically resolves with time.    Activity: No heavy lifting >20 pounds (children, pets, laundry, garbage) or strenuous activity for 4-6 weeks, but light activity and walking are encouraged. Do not drive or drink alcohol if taking narcotic pain medications or having pain that might distract from driving.  Wound care: 2 days after surgery (06/28), you may shower/get incision wet with soapy water and pat dry (do not rub incisions), but no baths or submerging incision underwater until follow-up.   Medications: Resume all home medications. For mild to moderate pain: acetaminophen (Tylenol) or ibuprofen/naproxen (if no kidney disease). Combining Tylenol with alcohol can substantially increase your risk of causing liver disease. Narcotic pain medications, if prescribed, can be used for severe pain, though may cause nausea, constipation, and drowsiness. Do not combine Tylenol and Percocet (or similar) within a 6 hour period as Percocet (and similar) contain(s) Tylenol. If you do not need the narcotic pain medication, you do not need to fill the prescription.  Call office [phone] / [phone]) at any time if any questions, worsening pain, fevers/chills, bleeding, drainage from incision site, or other concerns.

## 2022-12-19 NOTE — Anesthesia Procedure Notes (Addendum)
Procedure Name: General with mask airway Date/Time: 12/19/2022 10:05 AM  Performed by: Mohammed Kindle, CRNAPre-anesthesia Checklist: Patient identified, Emergency Drugs available, Suction available and Patient being monitored Patient Re-evaluated:Patient Re-evaluated prior to induction Oxygen Delivery Method: Circle system utilized Preoxygenation: Pre-oxygenation with 100% oxygen Induction Type: IV induction Ventilation: Mask ventilation without difficulty Laryngoscope Size: McGraph and 3 Grade View: Grade I Tube type: Oral Tube size: 7.0 mm Number of attempts: 1 Airway Equipment and Method: Stylet and Oral airway Placement Confirmation: ETT inserted through vocal cords under direct vision, positive ETCO2, breath sounds checked- equal and bilateral and CO2 detector Secured at: 21 cm Tube secured with: Tape Dental Injury: Teeth and Oropharynx as per pre-operative assessment

## 2022-12-19 NOTE — Consult Note (Signed)
[facility] (initial) - cpt: 99244   HISTORY OF PRESENT ILLNESS (HPI):  57 y.o. male presented to [facility] ED overnight for evaluation of abdominal pain. Patient reports about 7-10 days of upper abdominal pain now more localized to the RUQ. He did have most severe pain towards the beginning his symptoms and has been less intense since. Did have nausea and emesis at the beginning but this has resolved. No fever, chills, cough, CP, SOB, urinary changes, bowel changes, nor juandice. No history of similar prior to this. No previous abdominal surgeries. He did go see his GI physician (Dr Tobi Bastos) on 06/19 and got urgent CT Abdomen/Pelvis. This was done 06/25 and concerning for acute cholecystitis. As such, he presented to the ED. Laboratory work up here revealed a normal WBC at 8.3K, renal function was normal with sCr - 0.75, AST 46, ALT 91, bilirubin 0.6, lipase 60. He did get MRCP which again showed cholecystitis without choledocholithiasis.   Surgery is consulted by emergency medicine provider Ronnie Doss, PA-C in this context for evaluation and management of acute cholecystitis .  PAST MEDICAL HISTORY (PMH):  Past Medical History:  Diagnosis Date   BPH (benign prostatic hyperplasia)    ED (erectile dysfunction)    Kidney stone      PAST SURGICAL HISTORY (PSH):  Past Surgical History:  Procedure Laterality Date   TONSILLECTOMY       MEDICATIONS:  Prior to Admission medications   Medication Sig Start Date End Date Taking? Authorizing Provider  omeprazole (PRILOSEC) 40 MG capsule Take 1 capsule (40 mg total) by mouth daily. 12/18/21  Yes Karamalegos, Netta Neat, DO  sucralfate (CARAFATE) 1 g tablet Take 1 tablet (1 g total) by mouth 3 (three) times daily as needed. 12/10/22 12/10/23 Yes Willy Eddy, MD  fluticasone [facility]) 50 MCG/ACT nasal spray Place 2 sprays into both nostrils daily. Use for 4-6 weeks then stop and use seasonally or as needed.  04/17/18   Karamalegos, Netta Neat, DO  HYDROcodone-acetaminophen (NORCO) 5-325 MG tablet Take 1 tablet by mouth every 4 (four) hours as needed for moderate pain. Patient not taking: Reported on 12/12/2022 12/10/22   Willy Eddy, MD  tadalafil (CIALIS) 10 MG tablet 1 tab by mouth 1 hour prior to intercourse 12/18/21   Smitty Cords, DO     ALLERGIES:  Allergies  Allergen Reactions   Penicillin G Other (See Comments)     SOCIAL HISTORY:  Social History   Socioeconomic History   Marital status: Married    Spouse name: Not on file   Number of children: Not on file   Years of education: College   Highest education level: Not on file  Occupational History   Occupation: Pastor  Tobacco Use   Smoking status: Never   Smokeless tobacco: Former    Types: Chew    Quit date: 2007   Tobacco comments:    Smokeless tobacco for 10 years, quit 2007  Vaping Use   Vaping Use: Never used  Substance and Sexual Activity   Alcohol use: Never   Drug use: Never   Sexual activity: Yes    Birth control/protection: None  Other Topics Concern   Not on file  Social History Narrative   Not on file   Social Determinants of Health   Financial Resource Strain: Not on file  Food Insecurity: Not on file  Transportation Needs: Not on file  Physical Activity: Not on file  Stress: Not on file  Social Connections:  Not on file  Intimate Partner Violence: Not on file     FAMILY HISTORY:  Family History  Problem Relation Age of Onset   Diabetes Mother    Thyroid disease Mother    Cancer Father 56       throat, smoker   Stroke Father    Prostate cancer Neg Hx    Colon cancer Neg Hx       REVIEW OF SYSTEMS:  Review of Systems  Constitutional:  Negative for chills and fever.  Respiratory:  Negative for cough and shortness of breath.   Cardiovascular:  Negative for chest pain and palpitations.  Gastrointestinal:  Positive for abdominal pain, nausea and vomiting. Negative for  constipation and diarrhea.  Genitourinary:  Negative for dysuria and urgency.  All other systems reviewed and are negative.   VITAL SIGNS:  Temp:  [98.2 F (36.8 C)-98.4 F (36.9 C)] 98.2 F (36.8 C) (06/26 7253) Pulse Rate:  [56-93] 56 (06/26 0730) Resp:  [16-18] 18 (06/26 0730) BP: (112-144)/(73-99) 119/80 (06/26 0730) SpO2:  [96 %-98 %] 98 % (06/26 0730)             INTAKE/OUTPUT:  No intake/output data recorded.  PHYSICAL EXAM:  Physical Exam Vitals and nursing note reviewed. Exam conducted with a chaperone present.  Constitutional:      General: He is not in acute distress.    Appearance: He is well-developed and normal weight. He is not ill-appearing.  HENT:     Head: Normocephalic and atraumatic.  Abdominal:     General: Abdomen is flat. There is no distension.     Palpations: Abdomen is soft.     Tenderness: There is abdominal tenderness (Mild) in the right upper quadrant. There is no guarding or rebound.     Hernia: A hernia is present. Hernia is present in the umbilical area.     Comments: Abdomen is soft, minimal soreness in RUQ at this time, non-distended, no rebound/guarding. He does have soft, reducible umbilical hernia as well.   Genitourinary:    Comments: Deferred Skin:    General: Skin is warm and dry.     Coloration: Skin is not jaundiced.     Findings: No erythema.  Neurological:     General: No focal deficit present.     Mental Status: He is alert and oriented to person, place, and time.  Psychiatric:        Mood and Affect: Mood normal.        Behavior: Behavior normal.      Labs:     Latest Ref Rng & Units 12/18/2022    6:47 PM 12/10/2022   12:51 PM 11/21/2017   11:03 AM  CBC  WBC 4.0 - 10.5 K/uL 8.3  13.9  5.7   Hemoglobin 13.0 - 17.0 g/dL 66.4  40.3  47.4   Hematocrit 39.0 - 52.0 % 44.0  46.7  45.4   Platelets 150 - 400 K/uL 376  294  273       Latest Ref Rng & Units 12/18/2022    6:47 PM 12/10/2022   12:51 PM 11/21/2017   11:03 AM   CMP  Glucose 70 - 99 mg/dL 89  259  86   BUN 6 - 20 mg/dL 18  15  13    Creatinine 0.61 - 1.24 mg/dL 5.63  8.75  6.43   Sodium 135 - 145 mmol/L 134  138  140   Potassium 3.5 - 5.1 mmol/L 3.5  3.9  4.5  Chloride 98 - 111 mmol/L 101  103  105   CO2 22 - 32 mmol/L 24  24  28    Calcium 8.9 - 10.3 mg/dL 8.9  9.3  9.4   Total Protein 6.5 - 8.1 g/dL 8.1  8.2  7.1   Total Bilirubin 0.3 - 1.2 mg/dL 0.6  0.9  0.6   Alkaline Phos 38 - 126 U/L 98  46    AST 15 - 41 U/L 46  31  23   ALT 0 - 44 U/L 91  32  37      Imaging studies:   CT Abdomen/Pelvis (12/18/2022) personally reviewed with large gallstone, thickening of gallbladder and associated stranding, and radiologist report reviewed below:  IMPRESSION: Findings consistent with acute cholecystitis.   No evidence of biliary ductal dilatation.   Stable small umbilical hernia, which contains only fat.   Stable mildly enlarged prostate.    MRCP (12/19/2022) personally reviewed without gross choledocholithiasis, and radiologist report reviewed below;  IMPRESSION: 1. Study is positive for cholelithiasis with evidence of acute cholecystitis, as above. Surgical consultation is recommended. 2. Although there is minimal dilatation of the common bile duct (7 mm), there is no choledocholithiasis. Additionally, there is no evidence of intrahepatic biliary ductal dilatation to suggest biliary obstruction. 3. Small umbilical hernia containing omental fat.     Assessment/Plan: (ICD-10's: K81.0) 57 y.o. male with acute cholecystitis and umbilical hernia.   - Appreciate medicine admission - Plan for robotic assisted laparoscopic cholecystectomy & umbilical hernia repair with Dr Everlene Farrier pending OR/Anesthesia availability this morning   - All risks, benefits, and alternatives to above procedure(s) were discussed with the patient and his family, all of their questions were answered to their expressed satisfaction, patient expresses he wishes to  proceed, and informed consent was obtained.   - NPO + IVF Support  - IV Abx (Cipro + Flagyl) - ICG on-call to OR - Monitor abdominal examination; on-going bowel function   - Pain control prn; antiemetics prn - Mobilize   - Further management per primary service   - He may be able to DC post-operatively this afternoon, will prep instructions and follow up.   All of the above findings and recommendations were discussed with the patient and his family, and all of their questions were answered to their expressed satisfaction.  Thank you for the opportunity to participate in this patient's care.   -- Lynden Oxford, PA-C [facility] 12/19/2022, 7:34 AM M-F: 7am - 4pm

## 2022-12-19 NOTE — ED Notes (Signed)
Patient ambulatory independently to restroom and changing into hospital gown. Wife at bedside. NAD noted. Respirations even and unlabored.

## 2022-12-19 NOTE — Progress Notes (Signed)
Pt's care will be assumed by Dr Everlene Farrier. Will transfer service to Gen surgery. IM will sign off. Thanks  No charge

## 2022-12-19 NOTE — Anesthesia Preprocedure Evaluation (Signed)
Anesthesia Evaluation  Patient identified by MRN, date of birth, ID band Patient awake    Reviewed: Allergy & Precautions, NPO status , Patient's Chart, lab work & pertinent test results  Airway Mallampati: II  TM Distance: >3 FB Neck ROM: full    Dental  (+) Chipped, Dental Advidsory Given   Pulmonary neg pulmonary ROS   Pulmonary exam normal        Cardiovascular negative cardio ROS Normal cardiovascular exam     Neuro/Psych negative neurological ROS  negative psych ROS   GI/Hepatic negative GI ROS, Neg liver ROS,GERD  Medicated,,  Endo/Other  negative endocrine ROS    Renal/GU      Musculoskeletal   Abdominal   Peds  Hematology negative hematology ROS (+)   Anesthesia Other Findings Past Medical History: No date: BPH (benign prostatic hyperplasia) No date: ED (erectile dysfunction) No date: Kidney stone  Past Surgical History: No date: TONSILLECTOMY     Reproductive/Obstetrics negative OB ROS                             Anesthesia Physical Anesthesia Plan  ASA: 2  Anesthesia Plan: General ETT   Post-op Pain Management:    Induction: Intravenous  PONV Risk Score and Plan: 3 and Ondansetron, Dexamethasone, Midazolam and Treatment may vary due to age or medical condition  Airway Management Planned: Oral ETT  Additional Equipment:   Intra-op Plan:   Post-operative Plan: Extubation in OR  Informed Consent: I have reviewed the patients History and Physical, chart, labs and discussed the procedure including the risks, benefits and alternatives for the proposed anesthesia with the patient or authorized representative who has indicated his/her understanding and acceptance.     Dental Advisory Given  Plan Discussed with: Anesthesiologist, CRNA and Surgeon  Anesthesia Plan Comments: (Patient consented for risks of anesthesia including but not limited to:  - adverse  reactions to medications - damage to eyes, teeth, lips or other oral mucosa - nerve damage due to positioning  - sore throat or hoarseness - Damage to heart, brain, nerves, lungs, other parts of body or loss of life  Patient voiced understanding.)       Anesthesia Quick Evaluation

## 2022-12-19 NOTE — Anesthesia Postprocedure Evaluation (Signed)
Anesthesia Post Note  Patient: Dennis Keller  Procedure(s) Performed: XI ROBOTIC ASSISTED LAPAROSCOPIC CHOLECYSTECTOMY INDOCYANINE GREEN FLUORESCENCE IMAGING (ICG) HERNIA REPAIR UMBILICAL ADULT  Patient location during evaluation: PACU Anesthesia Type: General Level of consciousness: awake and alert Pain management: pain level controlled Vital Signs Assessment: post-procedure vital signs reviewed and stable Respiratory status: spontaneous breathing, nonlabored ventilation, respiratory function stable and patient connected to nasal cannula oxygen Cardiovascular status: blood pressure returned to baseline and stable Postop Assessment: no apparent nausea or vomiting Anesthetic complications: no  No notable events documented.   Last Vitals:  Vitals:   12/19/22 0913 12/19/22 1203  BP: 115/81 110/73  Pulse: 66 61  Resp: 16 15  Temp: 36.6 C   SpO2: 96% 97%    Last Pain:  Vitals:   12/19/22 0913  TempSrc: Temporal  PainSc: 0-No pain                 Stephanie Coup

## 2022-12-19 NOTE — ED Notes (Addendum)
Report given to OR at this time. Patient last ate or drank yesterday at lunch time. NAD noted at this time.

## 2022-12-19 NOTE — Assessment & Plan Note (Signed)
Not currently on related meds

## 2022-12-19 NOTE — H&P (Signed)
History and Physical    Patient: Dennis Keller:[id number] DOB: [birth year] DOA: 12/18/2022 DOS: the patient was seen and examined on 12/19/2022 PCP: Smitty Cords, DO  Patient coming from: Home  Chief Complaint:  Chief Complaint  Patient presents with   Abdominal Pain    HPI: Dennis Keller is a 57 y.o. male with medical history significant for BPH  who was sent to the ED by gastroenterologist, Dr. Tobi Bastos after outpatient CT abdomen and pelvis showed acute cholecystitis.  Patient initially presented with a 10-day history of mid to right sided upper abdominal pain.  He has had no nausea, vomiting, fever or chills.  And he denies diarrhea and dysuria. Outpatient CT was as follows:I "IMPRESSION: Findings consistent with acute cholecystitis.  No evidence of biliary ductal dilatation.  Stable small umbilical hernia, which contains only fat.  Stable mildly enlarged prostate". ED course and data review: Vitals within normal limits.  Labs with normal CBC, lipase 60, AST 46 and ALT 91 otherwise unremarkable  The ED provider: Spoke with surgeon on-call, Dr. Everlene Farrier who requested MRCP. MRCP prelim showing Choelcystitis, CBD dilatation but no stones identified.    Hospitalist consulted for admission   Review of Systems: As mentioned in the history of present illness. All other systems reviewed and are negative.  Past Medical History:  Diagnosis Date   BPH (benign prostatic hyperplasia)    ED (erectile dysfunction)    Kidney stone    Past Surgical History:  Procedure Laterality Date   TONSILLECTOMY     Social History:  reports that he has never smoked. He quit smokeless tobacco use about 17 years ago.  His smokeless tobacco use included chew. He reports that he does not drink alcohol and does not use drugs.  Allergies  Allergen Reactions   Penicillin G Other (See Comments)    Family History  Problem Relation Age of Onset   Diabetes Mother    Thyroid disease Mother     Cancer Father 62       throat, smoker   Stroke Father    Prostate cancer Neg Hx    Colon cancer Neg Hx     Prior to Admission medications   Medication Sig Start Date End Date Taking? Authorizing Provider  fluticasone (FLONASE) 50 MCG/ACT nasal spray Place 2 sprays into both nostrils daily. Use for 4-6 weeks then stop and use seasonally or as needed. 04/17/18   Karamalegos, Netta Neat, DO  HYDROcodone-acetaminophen (NORCO) 5-325 MG tablet Take 1 tablet by mouth every 4 (four) hours as needed for moderate pain. Patient not taking: Reported on 12/12/2022 12/10/22   Willy Eddy, MD  omeprazole (PRILOSEC) 40 MG capsule Take 1 capsule (40 mg total) by mouth daily. 12/18/21   Karamalegos, Netta Neat, DO  sucralfate (CARAFATE) 1 g tablet Take 1 tablet (1 g total) by mouth 3 (three) times daily as needed. 12/10/22 12/10/23  Willy Eddy, MD  tadalafil (CIALIS) 10 MG tablet 1 tab by mouth 1 hour prior to intercourse 12/18/21   Smitty Cords, DO    Physical Exam: Vitals:   12/18/22 1845 12/18/22 2335  BP: (!) 144/99 112/73  Pulse: 93 68  Resp: 18 16  Temp: 98.3 F (36.8 C) 98.4 F (36.9 C)  TempSrc: Oral Oral  SpO2: 98% 98%   Physical Exam Vitals and nursing note reviewed.  Constitutional:      General: He is not in acute distress. HENT:     Head: Normocephalic and atraumatic.  Cardiovascular:  Rate and Rhythm: Normal rate and regular rhythm.     Heart sounds: Normal heart sounds.  Pulmonary:     Effort: Pulmonary effort is normal.     Breath sounds: Normal breath sounds.  Abdominal:     Palpations: Abdomen is soft.     Tenderness: There is abdominal tenderness in the right upper quadrant and epigastric area.  Neurological:     Mental Status: Mental status is at baseline.     Labs on Admission: I have personally reviewed following labs and imaging studies  CBC: Recent Labs  Lab 12/18/22 1847  WBC 8.3  HGB 14.3  HCT 44.0  MCV 88.2  PLT 376    Basic Metabolic Panel: Recent Labs  Lab 12/18/22 1847  NA 134*  K 3.5  CL 101  CO2 24  GLUCOSE 89  BUN 18  CREATININE 0.75  CALCIUM 8.9   GFR: Estimated Creatinine Clearance: 103.1 mL/min (by C-G formula based on SCr of 0.75 mg/dL). Liver Function Tests: Recent Labs  Lab 12/18/22 1847  AST 46*  ALT 91*  ALKPHOS 98  BILITOT 0.6  PROT 8.1  ALBUMIN 3.9   Recent Labs  Lab 12/18/22 1847  LIPASE 60*   No results for input(s): "AMMONIA" in the last 168 hours. Coagulation Profile: No results for input(s): "INR", "PROTIME" in the last 168 hours. Cardiac Enzymes: No results for input(s): "CKTOTAL", "CKMB", "CKMBINDEX", "TROPONINI" in the last 168 hours. BNP (last 3 results) No results for input(s): "PROBNP" in the last 8760 hours. HbA1C: No results for input(s): "HGBA1C" in the last 72 hours. CBG: No results for input(s): "GLUCAP" in the last 168 hours. Lipid Profile: No results for input(s): "CHOL", "HDL", "LDLCALC", "TRIG", "CHOLHDL", "LDLDIRECT" in the last 72 hours. Thyroid Function Tests: No results for input(s): "TSH", "T4TOTAL", "FREET4", "T3FREE", "THYROIDAB" in the last 72 hours. Anemia Panel: No results for input(s): "VITAMINB12", "FOLATE", "FERRITIN", "TIBC", "IRON", "RETICCTPCT" in the last 72 hours. Urine analysis:    Component Value Date/Time   COLORURINE AMBER (A) 12/10/2022 1251   APPEARANCEUR HAZY (A) 12/10/2022 1251   LABSPEC 1.032 (H) 12/10/2022 1251   PHURINE 6.0 12/10/2022 1251   GLUCOSEU NEGATIVE 12/10/2022 1251   HGBUR NEGATIVE 12/10/2022 1251   BILIRUBINUR NEGATIVE 12/10/2022 1251   KETONESUR 20 (A) 12/10/2022 1251   PROTEINUR 100 (A) 12/10/2022 1251   NITRITE NEGATIVE 12/10/2022 1251   LEUKOCYTESUR NEGATIVE 12/10/2022 1251    Radiological Exams on Admission: CT ABDOMEN PELVIS W WO CONTRAST  Result Date: 12/18/2022 CLINICAL DATA:  Epigastric abdominal pain. EXAM: CT ABDOMEN AND PELVIS WITHOUT AND WITH CONTRAST TECHNIQUE:  Multidetector CT imaging of the abdomen and pelvis was performed following the standard protocol before and following the bolus administration of intravenous contrast. RADIATION DOSE REDUCTION: This exam was performed according to the departmental dose-optimization program which includes automated exposure control, adjustment of the mA and/or kV according to patient size and/or use of iterative reconstruction technique. CONTRAST:  OMNIPAQUE IOHEXOL 300 MG/ML  SOLN COMPARISON:  Noncontrast CT on 11/28/2008 FINDINGS: Lower Chest: No acute findings. Hepatobiliary: No suspicious hepatic masses identified. Noncalcified stone seen in gallbladder neck. Mild diffuse gallbladder wall thickening and pericholecystic soft tissue stranding, consistent with acute cholecystitis. No evidence of biliary ductal dilatation. Pancreas:  No mass or inflammatory changes. Spleen: Within normal limits in size and appearance. Adrenals/Urinary Tract: No suspicious masses identified. No evidence of urolithiasis or hydronephrosis. Stomach/Bowel: No evidence of obstruction, inflammatory process or abnormal fluid collections. Normal appendix visualized. Vascular/Lymphatic: No  pathologically enlarged lymph nodes. No acute vascular findings. Reproductive:  Stable mildly enlarged prostate gland. Other:  Stable small umbilical hernia, which contains only fat. Musculoskeletal:  No suspicious bone lesions identified. IMPRESSION: Findings consistent with acute cholecystitis. No evidence of biliary ductal dilatation. Stable small umbilical hernia, which contains only fat. Stable mildly enlarged prostate. These results will be called to the ordering clinician or representative by the Radiologist Assistant, and communication documented in the PACS or [employer]. Electronically Signed   By: Danae Orleans M.D.   On: 12/18/2022 17:15     Data Reviewed: Relevant notes from primary care and specialist visits, past discharge summaries as available  in EHR, including Care Everywhere. Prior diagnostic testing as pertinent to current admission diagnoses Updated medications and problem lists for reconciliation ED course, including vitals, labs, imaging, treatment and response to treatment Triage notes, nursing and pharmacy notes and ED provider's notes Notable results as noted in HPI   Assessment and Plan: * Acute cholecystitis Pain control N.p.o. on IV fluids Surgical consult Follow official MRCP report --- prelim showing Choelcystitis, CBD dilatation but no stones identified.   BPH (benign prostatic hyperplasia) Not currently on related meds     DVT prophylaxis: scd  Consults: surgery, Dr pabon  Advance Care Planning: full code  Family Communication: wife at bedside  Disposition Plan: Back to previous home environment  Severity of Illness: The appropriate patient status for this patient is INPATIENT. Inpatient status is judged to be reasonable and necessary in order to provide the required intensity of service to ensure the patient's safety. The patient's presenting symptoms, physical exam findings, and initial radiographic and laboratory data in the context of their chronic comorbidities is felt to place them at high risk for further clinical deterioration. Furthermore, it is not anticipated that the patient will be medically stable for discharge from the hospital within 2 midnights of admission.   * I certify that at the point of admission it is my clinical judgment that the patient will require inpatient hospital care spanning beyond 2 midnights from the point of admission due to high intensity of service, high risk for further deterioration and high frequency of surveillance required.*  Author: Andris Baumann, MD 12/19/2022 1:29 AM  For on call review [email or web address].

## 2022-12-19 NOTE — Assessment & Plan Note (Addendum)
Pain control N.p.o. on IV fluids Surgical consult Follow official MRCP report --- prelim showing Choelcystitis, CBD dilatation but no stones identified.

## 2022-12-19 NOTE — Op Note (Addendum)
Robotic assisted laparoscopic Cholecystectomy Umbilical hernia repair 3 cms incarcerated  Pre-operative Diagnosis: cholecystitis  Post-operative Diagnosis: same  Procedure:  Robotic assisted laparoscopic Cholecystectomy Umbilical hernia repair 3 cms incarcerated  Surgeon: Sterling Big, MD FACS  Anesthesia: Gen. with endotracheal tube  Findings: Severe acute Cholecystitis w empyema of the gallbladder  Window of safety achieved Accessory duct from the CBD to the GB clipped appropriately Incarcerated umbilical hernia 3 cms   Estimated Blood Loss: 50cc       Specimens: Gallbladder           Complications: none   Procedure Details  The patient was seen again in the Holding Room. The benefits, complications, treatment options, and expected outcomes were discussed with the patient. The risks of bleeding, infection, recurrence of symptoms, failure to resolve symptoms, bile duct damage, bile duct leak, retained common bile duct stone, bowel injury, any of which could require further surgery and/or ERCP, stent, or papillotomy were reviewed with the patient. The likelihood of improving the patient's symptoms with return to their baseline status is good.  The patient and/or family concurred with the proposed plan, giving informed consent.  The patient was taken to Operating Room, identified  and the procedure verified as Laparoscopic Cholecystectomy.  A Time Out was held and the above information confirmed.  Prior to the induction of general anesthesia, antibiotic prophylaxis was administered. VTE prophylaxis was in place. General endotracheal anesthesia was then administered and tolerated well. After the induction, the abdomen was prepped with Chloraprep and draped in the sterile fashion. The patient was positioned in the supine position. Periumbilical incision created, hernia sac was dissected, omentum was chronically incarcerated. The sac was opened and the omentum reduced. The sac was  excised. A Hasson trochar was placed after two vicryl stitches were anchored to the fascia. Pneumoperitoneum was then created with CO2 and tolerated well without any adverse changes in the patient's vital signs.  Three 8-mm ports were placed under direct vision. All skin incisions  were infiltrated with a local anesthetic agent before making the incision and placing the trocars.   The patient was positioned  in reverse Trendelenburg, robot was brought to the surgical field and docked in the standard fashion.  We made sure all the instrumentation was kept indirect view at all times and that there were no collision between the arms. I scrubbed out and went to the console.  The gallbladder was identified, the fundus grasped and retracted cephalad. Adhesions were lysed bluntly. The infundibulum was grasped and retracted laterally, exposing the peritoneum overlying the triangle of Calot. This was then divided and exposed in a blunt fashion. An extended critical view of the cystic duct and cystic artery was obtained.  The cystic duct was clearly identified and bluntly dissected.   Artery and duct were double clipped and divided. Using ICG cholangiography we visualize the cystic duct and CBD w/o evidence of bile injuries. There was an accesory duct from the CBD to the GB that was also identified and clipped I the standard fashion. The gallbladder was taken from the gallbladder fossa in a retrograde fashion with the electrocautery. THere was so much inflammation that the wall was very thin and ruptured, frank pus drained.   We aspirated the pus and bile  irrigated profusely w saline.  Hemostasis was achieved with the electrocautery. Inspection of the right upper quadrant was performed. No bleeding, bile duct injury or leak, or bowel injury was noted. Robotic instruments and robotic arms were undocked in the  standard fashion.  I scrubbed back in.  The gallbladder was removed and placed in an Endocatch bag.  I  placed 19 blake drain RUQ and secured to the skin 3-0 nylon  Pneumoperitoneum was released.   The umbilical hernia defect was closed with interrumpted 0 ethibond sutures. 4-0 subcuticular Monocryl was used to close the skin. Dermabond was  applied.  The patient was then extubated and brought to the recovery room in stable condition. Sponge, lap, and needle counts were correct at closure and at the conclusion of the case.               Sterling Big, MD, FACS

## 2022-12-19 NOTE — Transfer of Care (Signed)
Immediate Anesthesia Transfer of Care Note  Patient: Dennis Keller  Procedure(s) Performed: XI ROBOTIC ASSISTED LAPAROSCOPIC CHOLECYSTECTOMY INDOCYANINE GREEN FLUORESCENCE IMAGING (ICG) HERNIA REPAIR UMBILICAL ADULT  Patient Location: PACU  Anesthesia Type:General  Level of Consciousness: awake and drowsy  Airway & Oxygen Therapy: Patient Spontanous Breathing and Patient connected to face mask oxygen  Post-op Assessment: Report given to RN and Post -op Vital signs reviewed and stable  Post vital signs: Reviewed and stable  Last Vitals:  Vitals Value Taken Time  BP 110/73 12/19/22 1203  Temp    Pulse 61 12/19/22 1205  Resp 12 12/19/22 1205  SpO2 97 % 12/19/22 1205  Vitals shown include unvalidated device data.  Last Pain:  Vitals:   12/19/22 0913  TempSrc: Temporal  PainSc: 0-No pain         Complications: No notable events documented.

## 2022-12-19 NOTE — ED Notes (Signed)
Allena Katz, MD at bedside with patient at this time.

## 2022-12-20 ENCOUNTER — Encounter: Payer: Self-pay | Admitting: Gastroenterology

## 2022-12-20 LAB — COMPREHENSIVE METABOLIC PANEL
ALT: 154 U/L — ABNORMAL HIGH (ref 0–44)
AST: 92 U/L — ABNORMAL HIGH (ref 15–41)
Albumin: 3.1 g/dL — ABNORMAL LOW (ref 3.5–5.0)
Alkaline Phosphatase: 77 U/L (ref 38–126)
Anion gap: 5 (ref 5–15)
BUN: 12 mg/dL (ref 6–20)
CO2: 26 mmol/L (ref 22–32)
Calcium: 8.5 mg/dL — ABNORMAL LOW (ref 8.9–10.3)
Chloride: 106 mmol/L (ref 98–111)
Creatinine, Ser: 0.96 mg/dL (ref 0.61–1.24)
GFR, Estimated: >60 mL/min (ref >60)
Glucose, Bld: 120 mg/dL — ABNORMAL HIGH (ref 70–99)
Potassium: 4.1 mmol/L (ref 3.5–5.1)
Sodium: 137 mmol/L (ref 135–145)
Total Bilirubin: 0.4 mg/dL (ref 0.3–1.2)
Total Protein: 6.3 g/dL — ABNORMAL LOW (ref 6.5–8.1)

## 2022-12-20 MED ORDER — CIPROFLOXACIN HCL 500 MG PO TABS: 500.0000 mg | ORAL_TABLET | Freq: Two times a day (BID) | ORAL | 0 refills | Status: AC

## 2022-12-20 MED ORDER — OXYCODONE HCL 5 MG PO TABS: 5.0000 mg | ORAL_TABLET | Freq: Four times a day (QID) | ORAL | 0 refills | Status: DC | PRN

## 2022-12-20 MED ORDER — KETOROLAC TROMETHAMINE 30 MG/ML IJ SOLN
INTRAMUSCULAR | Status: AC
  Filled 2022-12-20: qty 1

## 2022-12-20 MED ORDER — METRONIDAZOLE 500 MG PO TABS: 500.0000 mg | ORAL_TABLET | Freq: Three times a day (TID) | ORAL | 0 refills | Status: AC

## 2022-12-20 MED ORDER — ACETAMINOPHEN 500 MG PO TABS
ORAL_TABLET | ORAL | Status: AC
  Filled 2022-12-20: qty 2

## 2022-12-20 MED ORDER — CIPROFLOXACIN IN D5W 400 MG/200ML IV SOLN
INTRAVENOUS | Status: AC
  Filled 2022-12-20: qty 200

## 2022-12-20 NOTE — Discharge Summary (Signed)
[facility] SURGICAL DISCHARGE SUMMARY  Patient ID: Dennis Keller MRN: [id number] DOB/AGE: 11-22-1965 57 y.o.  Admit date: 12/18/2022 Discharge date: 12/20/2022  Discharge Diagnoses Patient Active Problem List   Diagnosis Date Noted   Cholecystitis 12/19/2022   Umbilical hernia without obstruction and without gangrene 12/19/2022    Consultants None  Procedures 12/19/2022 Robotic Assisted Laparoscopic Cholecystectomy   HPI: 57 y.o. male presented to [facility] ED overnight for evaluation of abdominal pain. Patient reports about 7-10 days of upper abdominal pain now more localized to the RUQ. He did have most severe pain towards the beginning his symptoms and has been less intense since. Did have nausea and emesis at the beginning but this has resolved. No fever, chills, cough, CP, SOB, urinary changes, bowel changes, nor juandice. No history of similar prior to this. No previous abdominal surgeries. He did go see his GI physician (Dr Tobi Bastos) on 06/19 and got urgent CT Abdomen/Pelvis. This was done 06/25 and concerning for acute cholecystitis. As such, he presented to the ED. Laboratory work up here revealed a normal WBC at 8.3K, renal function was normal with sCr - 0.75, AST 46, ALT 91, bilirubin 0.6, lipase 60. He did get MRCP which again showed cholecystitis without choledocholithiasis   Hospital Course: Informed consent was obtained and documented, and patient underwent uneventful robotic assisted laparoscopic cholecystectomy (Dr Everlene Farrier, 12/19/2022).  Post-operatively, patient did well. He did stay for an additional 24 hours IV Abx. Advancement of patient's diet and ambulation were well-tolerated. The remainder of patient's hospital course was essentially unremarkable, and discharge planning was initiated accordingly with patient safely able to be discharged home with appropriate discharge instructions, antibiotics (Cipro / Flagyl x6 days to complete 7 days total from surgery),  pain control, and outpatient follow-up after all of his questions were answered to his expressed satisfaction.   Discharge Condition: Good    Physical Examination:  Constitutional: Well appearing male, NAD Pulmonary: Normal effort, no respiratory distress Gastrointestinal: Soft, non-tender, non-distended, no rebound/guarding. Surgical drian in right lateral port site; serosanguinous  Skin: Laparoscopic incisions are CDI with dermabond, no erythema or drainage    Allergies as of 12/20/2022       Reactions   Penicillin G Other (See Comments)        Medication List     TAKE these medications    ciprofloxacin 500 MG tablet Commonly known as: Cipro Take 1 tablet (500 mg total) by mouth 2 (two) times daily for 6 days.   fluticasone 50 MCG/ACT nasal spray Commonly known as: FLONASE Place 2 sprays into both nostrils daily. Use for 4-6 weeks then stop and use seasonally or as needed.   HYDROcodone-acetaminophen 5-325 MG tablet Commonly known as: Norco Take 1 tablet by mouth every 4 (four) hours as needed for moderate pain.   metroNIDAZOLE 500 MG tablet Commonly known as: Flagyl Take 1 tablet (500 mg total) by mouth 3 (three) times daily for 6 days.   omeprazole 40 MG capsule Commonly known as: PRILOSEC Take 1 capsule (40 mg total) by mouth daily.   oxyCODONE 5 MG immediate release tablet Commonly known as: Oxy IR/ROXICODONE Take 1 tablet (5 mg total) by mouth every 6 (six) hours as needed for severe pain or breakthrough pain.   sucralfate 1 g tablet Commonly known as: Carafate Take 1 tablet (1 g total) by mouth 3 (three) times daily as needed.   tadalafil 10 MG tablet Commonly known as: Cialis 1 tab by mouth 1 hour prior to intercourse  Follow-up Information     Donovan Kail, PA-C. Go on 12/25/2022.   Specialty: Physician Assistant Why: Go to appointment on 07/02 at 130 PM Contact information: [address] [place]  [address] [phone]                  Time spent on discharge management including discussion of hospital course, clinical condition, outpatient instructions, prescriptions, and follow up with the patient and members of the medical team: >30 minutes  -- Lynden Oxford , PA-C [facility]  12/20/2022, 9:18 AM [phone] M-F: 7am - 4pm

## 2022-12-20 NOTE — Progress Notes (Signed)
DISCHARGE NOTE:  Pt and wife given discharge instructions and verbalized understanding. Pt discharged with JP drain, JP drain teaching done, all questions answered. Pt wheeled to car by staff, wife providing transportation.

## 2022-12-20 NOTE — Plan of Care (Signed)
°  Problem: Activity: °Goal: Risk for activity intolerance will decrease °Outcome: Progressing °  °Problem: Elimination: °Goal: Will not experience complications related to urinary retention °Outcome: Progressing °  °Problem: Pain Managment: °Goal: General experience of comfort will improve °Outcome: Progressing °  °

## 2022-12-20 NOTE — Anesthesia Preprocedure Evaluation (Addendum)
Anesthesia Evaluation  Patient identified by MRN, date of birth, ID band Patient awake    Reviewed: Allergy & Precautions, H&P , NPO status , Patient's Chart, lab work & pertinent test results  Airway Mallampati: II  TM Distance: >3 FB Neck ROM: Full    Dental no notable dental hx. (+) Chipped   Pulmonary neg pulmonary ROS   Pulmonary exam normal breath sounds clear to auscultation       Cardiovascular negative cardio ROS Normal cardiovascular exam Rhythm:Regular Rate:Normal     Neuro/Psych negative neurological ROS  negative psych ROS   GI/Hepatic Neg liver ROS,GERD  ,,  Endo/Other  negative endocrine ROS    Renal/GU Renal disease  negative genitourinary   Musculoskeletal negative musculoskeletal ROS (+)    Abdominal   Peds negative pediatric ROS (+)  Hematology negative hematology ROS (+)   Anesthesia Other Findings Kidney stone  ED (erectile dysfunction) BPH (benign prostatic hyperplasia)  GERD (gastroesophageal reflux disease)    Reproductive/Obstetrics negative OB ROS                              Anesthesia Physical Anesthesia Plan  ASA: 2  Anesthesia Plan: General   Post-op Pain Management:    Induction: Intravenous  PONV Risk Score and Plan:   Airway Management Planned: Natural Airway and Nasal Cannula  Additional Equipment:   Intra-op Plan:   Post-operative Plan:   Informed Consent: I have reviewed the patients History and Physical, chart, labs and discussed the procedure including the risks, benefits and alternatives for the proposed anesthesia with the patient or authorized representative who has indicated his/her understanding and acceptance.     Dental Advisory Given  Plan Discussed with: Anesthesiologist, CRNA and Surgeon  Anesthesia Plan Comments: (Patient consented for risks of anesthesia including but not limited to:  - adverse reactions to  medications - risk of airway placement if required - damage to eyes, teeth, lips or other oral mucosa - nerve damage due to positioning  - sore throat or hoarseness - Damage to heart, brain, nerves, lungs, other parts of body or loss of life  Patient voiced understanding.)         Anesthesia Quick Evaluation

## 2022-12-24 ENCOUNTER — Telehealth: Payer: Self-pay

## 2022-12-24 NOTE — Transitions of Care (Post Inpatient/ED Visit) (Signed)
12/24/2022  Name: Dennis Keller MRN: [id number] DOB: 1965-09-25  Today's TOC FU Call Status: Today's TOC FU Call Status:: Successful TOC FU Call Competed TOC FU Call Complete Date: 12/24/22  Transition Care Management Follow-up Telephone Call Date of Discharge: 12/20/22 Discharge Facility: [facility]) Type of Discharge: Inpatient Admission Primary Inpatient Discharge Diagnosis:: Cholecystitis How have you been since you were released from the hospital?: Better Any questions or concerns?: No  Items Reviewed: Did you receive and understand the discharge instructions provided?: Yes Medications obtained,verified, and reconciled?: Yes (Medications Reviewed) Any new allergies since your discharge?: No Dietary orders reviewed?: Yes Do you have support at home?: Yes  Medications Reviewed Today: Medications Reviewed Today     Reviewed by Merleen Nicely, LPN (Licensed Practical Nurse) on 12/24/22 at 1126  Med List Status: <None>   Medication Order Taking? Sig Documenting Provider Last Dose Status Informant  ciprofloxacin (CIPRO) 500 MG tablet [id number] Yes Take 1 tablet (500 mg total) by mouth 2 (two) times daily for 6 days. Donovan Kail, PA-C Taking Active   fluticasone [facility]) 50 MCG/ACT nasal spray [id number] Yes Place 2 sprays into both nostrils daily. Use for 4-6 weeks then stop and use seasonally or as needed. Smitty Cords, DO Taking Active            Med Note Jerolyn Center, Tempie Hoist Dec 19, 2022 12:54 PM) As needed  [facility] EXTERNAL MEDICATION [id number] Yes Relief Factor [provider] Taking Active Self  HYDROcodone-acetaminophen (NORCO) 5-325 MG tablet [id number] No Take 1 tablet by mouth every 4 (four) hours as needed for moderate pain.  Patient not taking: Reported on 12/12/2022   Willy Eddy, MD Not Taking Active   metroNIDAZOLE (FLAGYL) 500 MG tablet [id number] Yes Take 1 tablet (500 mg total) by mouth 3  (three) times daily for 6 days. Donovan Kail, PA-C Taking Active   Multiple Vitamin (MULTIVITAMIN WITH MINERALS) TABS tablet [id number] Yes Take 1 tablet by mouth daily. [employer]NC 50+ [provider] Taking Active Self  omeprazole (PRILOSEC) 40 MG capsule [id number] Yes Take 1 capsule (40 mg total) by mouth daily. Smitty Cords, DO Taking Active   oxyCODONE (OXY IR/ROXICODONE) 5 MG immediate release tablet [id number] Yes Take 1 tablet (5 mg total) by mouth every 6 (six) hours as needed for severe pain or breakthrough pain. Donovan Kail, PA-C Taking Active   sucralfate (CARAFATE) 1 g tablet [id number] Yes Take 1 tablet (1 g total) by mouth 3 (three) times daily as needed. Willy Eddy, MD Taking Active   tadalafil (CIALIS) 10 MG tablet [id number] Yes 1 tab by mouth 1 hour prior to intercourse Smitty Cords, DO Taking Active            Med Note Jerolyn Center, Tempie Hoist Dec 19, 2022 12:55 PM) As needed            Home Care and Equipment/Supplies: Were Home Health Services Ordered?: No Any new equipment or medical supplies ordered?: No  Functional Questionnaire: Do you need assistance with bathing/showering or dressing?: No Do you need assistance with meal preparation?: No Do you need assistance with eating?: No Do you have difficulty maintaining continence: No Do you need assistance with getting out of bed/getting out of a chair/moving?: No Do you have difficulty managing or taking your medications?: No  Follow up appointments reviewed: PCP Follow-up appointment confirmed?: No MD Provider Line Number:[phone] Given: Yes Specialist Hospital Follow-up appointment  confirmed?: Yes Date of Specialist follow-up appointment?: 12/25/22 Follow-Up Specialty Provider:: Lynden Oxford PA-C Do you need transportation to your follow-up appointment?: No Do you understand care options if your condition(s) worsen?: Yes-patient verbalized  understanding    SIGNATURE  Woodfin Ganja LPN [facility] Nurse Health Advisor Direct Dial [phone]

## 2022-12-25 ENCOUNTER — Encounter: Payer: Self-pay | Admitting: Physician Assistant

## 2022-12-25 ENCOUNTER — Ambulatory Visit (INDEPENDENT_AMBULATORY_CARE_PROVIDER_SITE_OTHER): Payer: No Typology Code available for payment source | Admitting: Physician Assistant

## 2022-12-25 VITALS — BP 119/82 | HR 84 | Temp 98.9°F | Ht 69.0 in | Wt 176.0 lb

## 2022-12-25 DIAGNOSIS — K429 Umbilical hernia without obstruction or gangrene: Secondary | ICD-10-CM

## 2022-12-25 DIAGNOSIS — Z09 Encounter for follow-up examination after completed treatment for conditions other than malignant neoplasm: Secondary | ICD-10-CM

## 2022-12-25 DIAGNOSIS — Z9889 Other specified postprocedural states: Secondary | ICD-10-CM

## 2022-12-25 DIAGNOSIS — K819 Cholecystitis, unspecified: Secondary | ICD-10-CM

## 2022-12-25 NOTE — Progress Notes (Signed)
[facility] POST-OP OFFICE VISIT  12/25/2022  HPI: Dennis Keller is a 57 y.o. male 6 days s/p robotic assisted laparoscopic cholecystectomy for acute cholecystitis with gallbladder empyema and umbilical hernia repair with Dr Everlene Farrier   He is overall doing very well Abdomen is sore expectedly; not using pain medications No fever, chills, nausea, emesis He is tolerating PO; decreased appetite Stools are looser but this is improving Drain serosanguinous; <10 ccs daily  He is ambulating well; no issues Incisions are healing without incident   Vital signs: BP 119/82   Pulse 84   Temp 98.9 F (37.2 C)   Ht 5\' 9"  (1.753 m)   Wt 176 lb (79.8 kg)   SpO2 97%   BMI 25.99 kg/m    Physical Exam: Constitutional: Well appearing male, NAD Abdomen: Soft, incisional soreness, non-distended, no rebound/guarding. Surgical drain in right lateral port site; output serosanguinous (removed) Skin: Laparoscopic incisions are healing well, no erythema or drainage. These are ecchymotic in various healing stages   Assessment/Plan: This is a 57 y.o. male 6 days s/p robotic assisted laparoscopic cholecystectomy for acute cholecystitis with gallbladder empyema and umbilical hernia repair    - Drain removed; dressing placed  - Pain control prn  - Reviewed wound care recommendation  - Reviewed lifting restrictions; 4-6 weeks total  - Reviewed surgical pathology; Acute cholecystitis w/ focal necrosis   - He can follow up on as needed basis; He understands to call with questions/concerns  -- Lynden Oxford, PA-C [facility] 12/25/2022, 1:18 PM M-F: 7am - 4pm

## 2022-12-25 NOTE — Patient Instructions (Addendum)
You may shower. You can remove your dressing in two days. You may cover with a Band-Aid until it fully closes.   Follow up here as needed, call with any questions.   GENERAL POST-OPERATIVE PATIENT INSTRUCTIONS   WOUND CARE INSTRUCTIONS:  Try to keep the wound dry and avoid ointments on the wound unless directed to do so.  If the wound becomes bright red and painful or starts to drain infected material that is not clear, please contact your physician immediately.  If the wound is mildly pink and has a thick firm ridge underneath it, this is normal, and is referred to as a healing ridge.  This will resolve over the next 4-6 weeks.  BATHING: You may shower if you have been informed of this by your surgeon. However, Please do not submerge in a tub, hot tub, or pool until incisions are completely sealed or have been told by your surgeon that you may do so.  DIET:  You may eat any foods that you can tolerate.  It is a good idea to eat a high fiber diet and take in plenty of fluids to prevent constipation.  If you do become constipated you may want to take a mild laxative or take ducolax tablets on a daily basis until your bowel habits are regular.  Constipation can be very uncomfortable, along with straining, after recent surgery.  ACTIVITY:  You should not lift more than 20 pounds for 6 weeks total after surgery as it could put you at increased risk for complications.  Twenty pounds is roughly equivalent to a plastic bag of groceries. At that time- Listen to your body when lifting, if you have pain when lifting, stop and then try again in a few days. Soreness after doing exercises or activities of daily living is normal as you get back in to your normal routine.  MEDICATIONS:  Try to take narcotic medications and anti-inflammatory medications, such as tylenol, ibuprofen, naprosyn, etc., with food.  This will minimize stomach upset from the medication.  Should you develop nausea and vomiting from the pain  medication, or develop a rash, please discontinue the medication and contact your physician.  You should not drive, make important decisions, or operate machinery when taking narcotic pain medication.  SUNBLOCK Use sun block to incision area over the next year if this area will be exposed to sun. This helps decrease scarring and will allow you avoid a permanent darkened area over your incision.  QUESTIONS:  Please feel free to call our office if you have any questions, and we will be glad to assist you. [phone]

## 2022-12-31 ENCOUNTER — Ambulatory Visit: Payer: No Typology Code available for payment source | Admitting: Anesthesiology

## 2022-12-31 ENCOUNTER — Other Ambulatory Visit: Payer: Self-pay

## 2022-12-31 ENCOUNTER — Encounter
Admission: RE | Disposition: A | Discharge status: Home / Self Care | Payer: Self-pay | Source: Ambulatory Visit | Attending: Gastroenterology

## 2022-12-31 ENCOUNTER — Encounter: Payer: Self-pay | Admitting: Gastroenterology

## 2022-12-31 ENCOUNTER — Ambulatory Visit
Admission: RE | Admit: 2022-12-31 | Discharge: 2022-12-31 | Disposition: A | Discharge status: Home / Self Care | Payer: No Typology Code available for payment source | Source: Ambulatory Visit | Attending: Gastroenterology | Admitting: Gastroenterology

## 2022-12-31 DIAGNOSIS — R109 Unspecified abdominal pain: Secondary | ICD-10-CM

## 2022-12-31 DIAGNOSIS — Z800 Family history of malignant neoplasm of digestive organs: Secondary | ICD-10-CM | POA: Diagnosis not present

## 2022-12-31 DIAGNOSIS — K2289 Other specified disease of esophagus: Secondary | ICD-10-CM | POA: Diagnosis not present

## 2022-12-31 DIAGNOSIS — K219 Gastro-esophageal reflux disease without esophagitis: Secondary | ICD-10-CM | POA: Diagnosis present

## 2022-12-31 DIAGNOSIS — N529 Male erectile dysfunction, unspecified: Secondary | ICD-10-CM | POA: Insufficient documentation

## 2022-12-31 HISTORY — PX: ESOPHAGOGASTRODUODENOSCOPY (EGD) WITH PROPOFOL: SHX5813

## 2022-12-31 HISTORY — DX: Gastro-esophageal reflux disease without esophagitis: K21.9

## 2022-12-31 SURGERY — ESOPHAGOGASTRODUODENOSCOPY (EGD) WITH PROPOFOL
Anesthesia: General

## 2022-12-31 MED ORDER — PROPOFOL 10 MG/ML IV BOLUS
INTRAVENOUS | Status: DC | PRN
  Administered 2022-12-31: 40 mg via INTRAVENOUS
  Administered 2022-12-31: 100 mg via INTRAVENOUS

## 2022-12-31 MED ORDER — STERILE WATER FOR IRRIGATION IR SOLN
Status: DC | PRN
  Administered 2022-12-31: 50 mL

## 2022-12-31 MED ORDER — SODIUM CHLORIDE 0.9 % IV SOLN: INTRAVENOUS | Status: DC

## 2022-12-31 MED ORDER — LIDOCAINE HCL (CARDIAC) PF 100 MG/5ML IV SOSY
PREFILLED_SYRINGE | INTRAVENOUS | Status: DC | PRN
  Administered 2022-12-31: 50 mg via INTRAVENOUS

## 2022-12-31 MED ORDER — LACTATED RINGERS IV SOLN: INTRAVENOUS | Status: DC

## 2022-12-31 SURGICAL SUPPLY — 7 items
BLOCK BITE 60FR ADLT L/F GRN (MISCELLANEOUS) ×1 IMPLANT
GOWN CVR UNV OPN BCK APRN NK (MISCELLANEOUS) ×2 IMPLANT
GOWN ISOL THUMB LOOP REG UNIV (MISCELLANEOUS) ×2
KIT PRC NS LF DISP ENDO (KITS) ×1 IMPLANT
KIT PROCEDURE OLYMPUS (KITS) ×1
MANIFOLD NEPTUNE II (INSTRUMENTS) ×1 IMPLANT
WATER STERILE IRR 250ML POUR (IV SOLUTION) ×1 IMPLANT

## 2022-12-31 NOTE — Transfer of Care (Signed)
Immediate Anesthesia Transfer of Care Note  Patient: Dennis Keller  Procedure(s) Performed: ESOPHAGOGASTRODUODENOSCOPY (EGD) WITH PROPOFOL  Patient Location: PACU  Anesthesia Type: General  Level of Consciousness: awake, alert  and patient cooperative  Airway and Oxygen Therapy: Patient Spontanous Breathing and Patient connected to supplemental oxygen  Post-op Assessment: Post-op Vital signs reviewed, Patient's Cardiovascular Status Stable, Respiratory Function Stable, Patent Airway and No signs of Nausea or vomiting  Post-op Vital Signs: Reviewed and stable  Complications: No notable events documented.

## 2022-12-31 NOTE — Op Note (Signed)
[facility] Patient Name: Dennis Keller Procedure Date: 12/31/2022 9:58 AM MRN: [id number] Account #: [id number] Date of Birth: [birth year] Admit Type: Outpatient Age: 57 Room: [facility] OR ROOM 01 Gender: Male Note Status: Finalized Instrument Name: [id number] Procedure:             Upper GI endoscopy Indications:           Heartburn Providers:             Midge Minium MD, MD Referring MD:          Smitty Cords (Referring MD) Medicines:             Propofol per Anesthesia Complications:         No immediate complications. Procedure:             Pre-Anesthesia Assessment:                        - Prior to the procedure, a History and Physical was                         performed, and patient medications and allergies were                         reviewed. The patient's tolerance of previous                         anesthesia was also reviewed. The risks and benefits                         of the procedure and the sedation options and risks                         were discussed with the patient. All questions were                         answered, and informed consent was obtained. Prior                         Anticoagulants: The patient has taken no anticoagulant                         or antiplatelet agents. ASA Grade Assessment: II - A                         patient with mild systemic disease. After reviewing                         the risks and benefits, the patient was deemed in                         satisfactory condition to undergo the procedure.                        After obtaining informed consent, the endoscope was                         passed under direct vision. Throughout the procedure,  the patient's blood pressure, pulse, and oxygen                         saturations were monitored continuously. The Endoscope                         was introduced through the mouth, and advanced to the                          second part of duodenum. The upper GI endoscopy was                         accomplished without difficulty. The patient tolerated                         the procedure well. Findings:      The Z-line was irregular and was found at the gastroesophageal junction.      The stomach was normal.      The examined duodenum was normal. Impression:            - Z-line irregular, at the gastroesophageal junction.                        - Normal stomach.                        - Normal examined duodenum.                        - No specimens collected. Recommendation:        - Discharge patient to home.                        - Resume previous diet.                        - Continue present medications. Procedure Code(s):     --- Professional ---                        [phone], Esophagogastroduodenoscopy, flexible,                         transoral; diagnostic, including collection of                         specimen(s) by brushing or washing, when performed                         (separate procedure) Diagnosis Code(s):     --- Professional ---                        R12, Heartburn                        K22.89, Other specified disease of esophagus CPT copyright 2022 American Medical Association. All rights reserved. The codes documented in this report are preliminary and upon coder review may  be revised to meet current compliance requirements. Midge Minium MD, MD 12/31/2022 10:11:48 AM This report has been signed electronically. Number of Addenda: 0 Note Initiated On: 12/31/2022 9:58 AM  Total Procedure Duration: 0 hours 3 minutes 14 seconds  Estimated Blood Loss:  Estimated blood loss: none.      [facility]

## 2022-12-31 NOTE — H&P (Signed)
Midge Minium, MD [facility] [address] [place], [place] [address] Phone:[phone] Fax : [phone]  Primary Care Physician:  Smitty Cords, DO Primary Gastroenterologist:  Dr. Servando Snare  Pre-Procedure History & Physical: HPI:  Dennis Keller is a 57 y.o. male is here for an endoscopy.   Past Medical History:  Diagnosis Date   BPH (benign prostatic hyperplasia)    ED (erectile dysfunction)    GERD (gastroesophageal reflux disease)    Kidney stone     Past Surgical History:  Procedure Laterality Date   CHOLECYSTECTOMY     TONSILLECTOMY     UMBILICAL HERNIA REPAIR N/A 12/19/2022   Procedure: HERNIA REPAIR UMBILICAL ADULT;  Surgeon: Leafy Ro, MD;  Location: [facility] ORS;  Service: General;  Laterality: N/A;    Prior to Admission medications   Medication Sig Start Date End Date Taking? Authorizing Provider  GENERIC EXTERNAL MEDICATION Relief Factor   Yes [provider]  Multiple Vitamin (MULTIVITAMIN WITH MINERALS) TABS tablet Take 1 tablet by mouth daily. GNC 50+   Yes [provider]  omeprazole (PRILOSEC) 40 MG capsule Take 1 capsule (40 mg total) by mouth daily. 12/18/21  Yes Karamalegos, Netta Neat, DO  sucralfate (CARAFATE) 1 g tablet Take 1 tablet (1 g total) by mouth 3 (three) times daily as needed. 12/10/22 12/10/23 Yes Willy Eddy, MD  fluticasone [facility]) 50 MCG/ACT nasal spray Place 2 sprays into both nostrils daily. Use for 4-6 weeks then stop and use seasonally or as needed. 04/17/18   Karamalegos, Netta Neat, DO  tadalafil (CIALIS) 10 MG tablet 1 tab by mouth 1 hour prior to intercourse 12/18/21   Smitty Cords, DO    Allergies as of 12/12/2022 - Review Complete 12/12/2022  Allergen Reaction Noted   Penicillin g Other (See Comments) 08/12/2015    Family History  Problem Relation Age of Onset   Diabetes Mother    Thyroid disease Mother    Cancer Father 12       throat, smoker   Stroke Father     Prostate cancer Neg Hx    Colon cancer Neg Hx     Social History   Socioeconomic History   Marital status: Married    Spouse name: Not on file   Number of children: Not on file   Years of education: College   Highest education level: Not on file  Occupational History   Occupation: Education officer, environmental  Tobacco Use   Smoking status: Never    Passive exposure: Never   Smokeless tobacco: Former    Types: Chew    Quit date: 2007   Tobacco comments:    Smokeless tobacco for 10 years, quit 2007  Vaping Use   Vaping Use: Never used  Substance and Sexual Activity   Alcohol use: Never   Drug use: Never   Sexual activity: Yes    Birth control/protection: None  Other Topics Concern   Not on file  Social History Narrative   Not on file   Social Determinants of Health   Financial Resource Strain: Not on file  Food Insecurity: No Food Insecurity (12/19/2022)   Hunger Vital Sign    Worried About Running Out of Food in the Last Year: Never true    Ran Out of Food in the Last Year: Never true  Transportation Needs: No Transportation Needs (12/19/2022)   PRAPARE - Administrator, Civil Service (Medical): No    Lack of Transportation (Non-Medical): No  Physical Activity:  Not on file  Stress: Not on file  Social Connections: Not on file  Intimate Partner Violence: Not At Risk (12/19/2022)   Humiliation, Afraid, Rape, and Kick questionnaire    Fear of Current or Ex-Partner: No    Emotionally Abused: No    Physically Abused: No    Sexually Abused: No    Review of Systems: See HPI, otherwise negative ROS  Physical Exam: BP 111/81   Pulse 69   Temp 98 F (36.7 C) (Temporal)   Resp 18   Ht 5\' 9"  (1.753 m)   Wt 79 kg   SpO2 96%   BMI 25.72 kg/m  General:   Alert,  pleasant and cooperative in NAD Head:  Normocephalic and atraumatic. Neck:  Supple; no masses or thyromegaly. Lungs:  Clear throughout to auscultation.    Heart:  Regular rate and rhythm. Abdomen:  Soft, nontender  and nondistended. Normal bowel sounds, without guarding, and without rebound.   Neurologic:  Alert and  oriented x4;  grossly normal neurologically.  Impression/Plan: AVORY BARRETTA is here for an endoscopy to be performed for GERD  Risks, benefits, limitations, and alternatives regarding  endoscopy have been reviewed with the patient.  Questions have been answered.  All parties agreeable.   Midge Minium, MD  12/31/2022, 9:28 AM

## 2022-12-31 NOTE — Anesthesia Postprocedure Evaluation (Signed)
Anesthesia Post Note  Patient: Dennis Keller  Procedure(s) Performed: ESOPHAGOGASTRODUODENOSCOPY (EGD) WITH PROPOFOL  Patient location during evaluation: PACU Anesthesia Type: General Level of consciousness: awake and alert Pain management: pain level controlled Vital Signs Assessment: post-procedure vital signs reviewed and stable Respiratory status: spontaneous breathing, nonlabored ventilation, respiratory function stable and patient connected to nasal cannula oxygen Cardiovascular status: blood pressure returned to baseline and stable Postop Assessment: no apparent nausea or vomiting Anesthetic complications: no   No notable events documented.   Last Vitals:  Vitals:   12/31/22 1016 12/31/22 1024  BP: 105/78 (!) 98/56  Pulse: 69 66  Resp: 16 17  Temp:    SpO2: 96% 94%    Last Pain:  Vitals:   12/31/22 1024  TempSrc:   PainSc: 0-No pain                 Taysha Majewski C Essam Lowdermilk

## 2023-01-01 ENCOUNTER — Encounter: Payer: Self-pay | Admitting: Gastroenterology

## 2023-01-08 ENCOUNTER — Ambulatory Visit (INDEPENDENT_AMBULATORY_CARE_PROVIDER_SITE_OTHER): Payer: No Typology Code available for payment source | Admitting: Gastroenterology

## 2023-01-08 ENCOUNTER — Encounter: Payer: Self-pay | Admitting: Gastroenterology

## 2023-01-08 VITALS — BP 126/84 | HR 92 | Temp 98.1°F | Wt 179.0 lb

## 2023-01-08 DIAGNOSIS — R109 Unspecified abdominal pain: Secondary | ICD-10-CM | POA: Diagnosis not present

## 2023-01-08 NOTE — Progress Notes (Signed)
Wyline Mood MD, MRCP(U.K) [address]  [address]  [place], [place] [address]  Main: [phone]  Fax: [phone]   Primary Care Physician: Smitty Cords, DO  Primary Gastroenterologist:  Dr. Wyline Mood   Chief Complaint  Patient presents with   Abdominal Pain    HPI: Dennis Keller is a 57 y.o. male   Summary of history :  Initially referred and seen in June 2024 for abdominal pain as he presented to the emergency room.He was treated conservatively with GI cocktail and discharged. Hemoglobin was 15 g creatinine 0.89, lipase normal. In the ER his EKG showed a heart rate of 65 bpm.   The abdominal pain had been going on for a few weeks.  No heartburn.  The pain is sharp in nature nonradiating.  He is not able to eat or drink much.  He did try eating or drinking and caused him to throw up.  Denies any NSAID use.  No change in bowel habits.  He has had an increase in the dosage of his PPI to twice a day omeprazole 40 mg in addition has been taking hydrocodone as well as Carafate.  This has helped to a degree with the pain.  Interval history   12/12/2022-01/08/2023 12/18/2022 CT scan of the abdomen pelvis with and without contrast showed features of acute cholecystitis 12/19/2022 MRCP showed cholelithiasis with evidence of Coley cystitis. Underwent laparoscopic cholecystectomy gallbladder showed features of acute cholecystitis and focal necrosis. 12/12/2022 H. pylori breath test negative.  12/31/2022: EGD: Normal. 12/25/2022 surgery follow-up postoperative doing well. Since his surgery he has been doing well.  No abdominal pain.  No diarrhea.  He has had some issues with perianal itching and burning sensation likely due to hemorrhoids which she would like to have them banded but is concerned about the cost due to his present bills.  Current Outpatient Medications  Medication Sig Dispense Refill   fluticasone (FLONASE) 50 MCG/ACT nasal spray Place 2 sprays into both  nostrils daily. Use for 4-6 weeks then stop and use seasonally or as needed. 16 g 3   GENERIC EXTERNAL MEDICATION Relief Factor     Multiple Vitamin (MULTIVITAMIN WITH MINERALS) TABS tablet Take 1 tablet by mouth daily. GNC 50+     omeprazole (PRILOSEC) 40 MG capsule Take 1 capsule (40 mg total) by mouth daily. 30 capsule 11   sucralfate (CARAFATE) 1 g tablet Take 1 tablet (1 g total) by mouth 3 (three) times daily as needed. 30 tablet 0   tadalafil (CIALIS) 10 MG tablet 1 tab by mouth 1 hour prior to intercourse 30 tablet 5   No current facility-administered medications for this visit.    Allergies as of 01/08/2023 - Review Complete 01/08/2023  Allergen Reaction Noted   Penicillin g Other (See Comments) 08/12/2015       ROS:  General: Negative for anorexia, weight loss, fever, chills, fatigue, weakness. ENT: Negative for hoarseness, difficulty swallowing , nasal congestion. CV: Negative for chest pain, angina, palpitations, dyspnea on exertion, peripheral edema.  Respiratory: Negative for dyspnea at rest, dyspnea on exertion, cough, sputum, wheezing.  GI: See history of present illness. GU:  Negative for dysuria, hematuria, urinary incontinence, urinary frequency, nocturnal urination.  Endo: Negative for unusual weight change.    Physical Examination:   BP 126/84   Pulse 92   Temp 98.1 F (36.7 C) (Oral)   Wt 179 lb (81.2 kg)   BMI 26.43 kg/m   General: Well-nourished, well-developed in  no acute distress.  Eyes: No icterus. Conjunctivae pink.  Abdomen: Bowel sounds are normal, nontender, nondistended, no hepatosplenomegaly or masses, no abdominal bruits or hernia , no rebound or guarding.  Scars from cholecystectomy healing well no localized infection Extremities: No lower extremity edema. No clubbing or deformities. Neuro: Alert and oriented x 3.  Grossly intact. Skin: Warm and dry, no jaundice.   Psych: Alert and cooperative, normal mood and affect.   Imaging  Studies: MR ABDOMEN MRCP W WO CONTAST  Result Date: 12/19/2022 CLINICAL DATA:  57 year old male history of abdominal pain. Possible acute cholecystitis noted on recent CT examination. Follow-up study. EXAM: MRI ABDOMEN WITHOUT AND WITH CONTRAST (INCLUDING MRCP) TECHNIQUE: Multiplanar multisequence MR imaging of the abdomen was performed both before and after the administration of intravenous contrast. Heavily T2-weighted images of the biliary and pancreatic ducts were obtained, and three-dimensional MRCP images were rendered by post processing. CONTRAST:  8mL GADAVIST GADOBUTROL 1 MMOL/ML IV SOLN COMPARISON:  CT of the abdomen and pelvis 12/18/2022. No prior abdominal MRI. FINDINGS: Lower chest: Unremarkable. Hepatobiliary: No suspicious cystic or solid hepatic lesions. No intra hepatic biliary ductal dilatation noted on MRCP images. Common bile duct is mildly dilated measuring 7 mm. No filling defects are noted within the common bile duct to suggest choledocholithiasis. Filling defects are evident lying dependently in the gallbladder, largest of which is a 2 cm filling defect in the region of the neck of the gallbladder, compatible with gallstones. Gallbladder wall appears thickened and edematous with trace volume of pericholecystic fluid. Pancreas: No pancreatic mass. No pancreatic ductal dilatation. No pancreatic or peripancreatic fluid collections or inflammatory changes. Spleen:  Unremarkable. Adrenals/Urinary Tract: Subcentimeter T1 hypointense, T2 hyperintense, nonenhancing lesions in both kidneys are compatible with small simple cysts (Bosniak class 1, no imaging follow-up recommended). No aggressive appearing renal lesions are otherwise noted. No hydroureteronephrosis in the visualized portions of the abdomen. Bilateral adrenal glands are normal in appearance. Stomach/Bowel: Visualized portions are unremarkable. Vascular/Lymphatic: No aneurysm identified in the visualized abdominal vasculature. No  lymphadenopathy noted in the abdomen. Other: Small umbilical hernia partially imaged containing only fat. No significant volume of ascites noted in the visualized portions of the peritoneal cavity. Musculoskeletal: No aggressive appearing osseous lesions are noted in the visualized portions of the skeleton. IMPRESSION: 1. Study is positive for cholelithiasis with evidence of acute cholecystitis, as above. Surgical consultation is recommended. 2. Although there is minimal dilatation of the common bile duct (7 mm), there is no choledocholithiasis. Additionally, there is no evidence of intrahepatic biliary ductal dilatation to suggest biliary obstruction. 3. Small umbilical hernia containing omental fat. Electronically Signed   By: Trudie Reed M.D.   On: 12/19/2022 06:12   MR 3D Recon At Scanner  Result Date: 12/19/2022 CLINICAL DATA:  57 year old male history of abdominal pain. Possible acute cholecystitis noted on recent CT examination. Follow-up study. EXAM: MRI ABDOMEN WITHOUT AND WITH CONTRAST (INCLUDING MRCP) TECHNIQUE: Multiplanar multisequence MR imaging of the abdomen was performed both before and after the administration of intravenous contrast. Heavily T2-weighted images of the biliary and pancreatic ducts were obtained, and three-dimensional MRCP images were rendered by post processing. CONTRAST:  8mL GADAVIST GADOBUTROL 1 MMOL/ML IV SOLN COMPARISON:  CT of the abdomen and pelvis 12/18/2022. No prior abdominal MRI. FINDINGS: Lower chest: Unremarkable. Hepatobiliary: No suspicious cystic or solid hepatic lesions. No intra hepatic biliary ductal dilatation noted on MRCP images. Common bile duct is mildly dilated measuring 7 mm. No filling defects are noted within the  common bile duct to suggest choledocholithiasis. Filling defects are evident lying dependently in the gallbladder, largest of which is a 2 cm filling defect in the region of the neck of the gallbladder, compatible with gallstones.  Gallbladder wall appears thickened and edematous with trace volume of pericholecystic fluid. Pancreas: No pancreatic mass. No pancreatic ductal dilatation. No pancreatic or peripancreatic fluid collections or inflammatory changes. Spleen:  Unremarkable. Adrenals/Urinary Tract: Subcentimeter T1 hypointense, T2 hyperintense, nonenhancing lesions in both kidneys are compatible with small simple cysts (Bosniak class 1, no imaging follow-up recommended). No aggressive appearing renal lesions are otherwise noted. No hydroureteronephrosis in the visualized portions of the abdomen. Bilateral adrenal glands are normal in appearance. Stomach/Bowel: Visualized portions are unremarkable. Vascular/Lymphatic: No aneurysm identified in the visualized abdominal vasculature. No lymphadenopathy noted in the abdomen. Other: Small umbilical hernia partially imaged containing only fat. No significant volume of ascites noted in the visualized portions of the peritoneal cavity. Musculoskeletal: No aggressive appearing osseous lesions are noted in the visualized portions of the skeleton. IMPRESSION: 1. Study is positive for cholelithiasis with evidence of acute cholecystitis, as above. Surgical consultation is recommended. 2. Although there is minimal dilatation of the common bile duct (7 mm), there is no choledocholithiasis. Additionally, there is no evidence of intrahepatic biliary ductal dilatation to suggest biliary obstruction. 3. Small umbilical hernia containing omental fat. Electronically Signed   By: Trudie Reed M.D.   On: 12/19/2022 06:12   CT ABDOMEN PELVIS W WO CONTRAST  Result Date: 12/18/2022 CLINICAL DATA:  Epigastric abdominal pain. EXAM: CT ABDOMEN AND PELVIS WITHOUT AND WITH CONTRAST TECHNIQUE: Multidetector CT imaging of the abdomen and pelvis was performed following the standard protocol before and following the bolus administration of intravenous contrast. RADIATION DOSE REDUCTION: This exam was performed  according to the departmental dose-optimization program which includes automated exposure control, adjustment of the mA and/or kV according to patient size and/or use of iterative reconstruction technique. CONTRAST:  OMNIPAQUE IOHEXOL 300 MG/ML  SOLN COMPARISON:  Noncontrast CT on 11/28/2008 FINDINGS: Lower Chest: No acute findings. Hepatobiliary: No suspicious hepatic masses identified. Noncalcified stone seen in gallbladder neck. Mild diffuse gallbladder wall thickening and pericholecystic soft tissue stranding, consistent with acute cholecystitis. No evidence of biliary ductal dilatation. Pancreas:  No mass or inflammatory changes. Spleen: Within normal limits in size and appearance. Adrenals/Urinary Tract: No suspicious masses identified. No evidence of urolithiasis or hydronephrosis. Stomach/Bowel: No evidence of obstruction, inflammatory process or abnormal fluid collections. Normal appendix visualized. Vascular/Lymphatic: No pathologically enlarged lymph nodes. No acute vascular findings. Reproductive:  Stable mildly enlarged prostate gland. Other:  Stable small umbilical hernia, which contains only fat. Musculoskeletal:  No suspicious bone lesions identified. IMPRESSION: Findings consistent with acute cholecystitis. No evidence of biliary ductal dilatation. Stable small umbilical hernia, which contains only fat. Stable mildly enlarged prostate. These results will be called to the ordering clinician or representative by the Radiologist Assistant, and communication documented in the PACS or [employer]. Electronically Signed   By: Danae Orleans M.D.   On: 12/18/2022 17:15    Assessment and Plan:   DERRIN CURREY is a 57 y.o. y/o male here to follow-up for abdominal pain EGD was normal.  Imaging demonstrated acute cholecystitis s/p cholecystectomy doing well.  In addition has issues with perianal itching and burning likely from hemorrhoids.  Denies any rectal bleeding change in bowel habits.   Underwent Cologuard in 2023 negative  Provided high-fiber diet counseling on perianal hygiene.  We will try and provide  information about hemorrhoidal banding to discuss at next visit if he has persistent issues.   Dr Wyline Mood  MD,MRCP [facility]) Follow up as needed

## 2023-01-08 NOTE — Patient Instructions (Addendum)
Please apply for the [employer] financial assistance first and mail the information so they could see if you qualify. Then they will let you know how much it would roughly cost you to have the hemorrhoid banding x 3.  Hemorrhoids Hemorrhoids are swollen veins that may form: In the butt (rectum). These are called internal hemorrhoids. Around the opening of the butt (anus). These are called external hemorrhoids. Most hemorrhoids do not cause very bad problems. They often get better with changes to your lifestyle and what you eat. What are the causes? Having trouble pooping (constipation) or watery poop (diarrhea). Pushing too hard when you poop. Pregnancy. Being very overweight (obese). Sitting for too long. Riding a bike for a long time. Heavy lifting or other things that take a lot of effort. Anal sex. What are the signs or symptoms? Pain. Itching or soreness in the butt. Bleeding from the butt. Leaking poop. Swelling. One or more lumps around the opening of your butt. How is this treated? In most cases, hemorrhoids can be treated at home. You may be told to: Change what you eat. Make changes to your lifestyle. If these treatments do not help, you may need to have a procedure done. Your doctor may need to: Place rubber bands at the bottom of the hemorrhoids to make them fall off. Put medicine into the hemorrhoids to shrink them. Shine a type of light on the hemorrhoids to cause them to fall off. Do surgery to get rid of the hemorrhoids. Follow these instructions at home: Medicines Take over-the-counter and prescription medicines only as told by your doctor. Use creams with medicine in them or medicines that you put in your butt as told by your doctor. Eating and drinking  Eat foods that have a lot of fiber in them. These include whole grains, beans, nuts, fruits, and vegetables. Ask your doctor about taking products that have fiber added to them (fibersupplements). Take in  less fat. You can do this by: Eating low-fat dairy products. Eating less red meat. Staying away from processed foods. Drink enough fluid to keep your pee (urine) pale yellow. Managing pain and swelling  Take a warm-water bath (sitz bath) for 20 minutes to ease pain. Do this 3-4 times a day. You may do this in a bathtub. You may also use a portable sitz bath that fits over the toilet. If told, put ice on the painful area. It may help to use ice between your warm baths. Put ice in a plastic bag. Place a towel between your skin and the bag. Leave the ice on for 20 minutes, 2-3 times a day. If your skin turns bright red, take off the ice right away to prevent skin damage. The risk of damage is higher if you cannot feel pain, heat, or cold. General instructions Exercise. Ask your doctor how much and what kind of exercise is best for you. Go to the bathroom when you need to poop. Do not wait. Try not to push too hard when you poop. Keep your butt dry and clean. Use wet toilet paper or moist towelettes after you poop. Do not sit on the toilet for a long time. Contact a doctor if: You have pain and swelling that do not get better with treatment. You have trouble pooping. You cannot poop. You have pain or swelling outside the area of the hemorrhoids. Get help right away if: You have bleeding from the butt that will not stop. This information is not intended to replace advice  given to you by your health care provider. Make sure you discuss any questions you have with your health care provider. Document Revised: 02/21/2022 Document Reviewed: 02/21/2022 Elsevier Patient Education  2024 [employer].

## 2023-07-12 ENCOUNTER — Ambulatory Visit: Payer: No Typology Code available for payment source | Admitting: Family Medicine

## 2023-07-12 ENCOUNTER — Encounter: Payer: Self-pay | Admitting: Family Medicine

## 2023-07-12 VITALS — BP 132/72 | HR 77 | Ht 69.0 in | Wt 190.0 lb

## 2023-07-12 DIAGNOSIS — H7393 Unspecified disorder of tympanic membrane, bilateral: Secondary | ICD-10-CM | POA: Diagnosis not present

## 2023-07-12 DIAGNOSIS — N401 Enlarged prostate with lower urinary tract symptoms: Secondary | ICD-10-CM | POA: Diagnosis not present

## 2023-07-12 DIAGNOSIS — J329 Chronic sinusitis, unspecified: Secondary | ICD-10-CM

## 2023-07-12 DIAGNOSIS — K219 Gastro-esophageal reflux disease without esophagitis: Secondary | ICD-10-CM

## 2023-07-12 DIAGNOSIS — N529 Male erectile dysfunction, unspecified: Secondary | ICD-10-CM

## 2023-07-12 MED ORDER — AZITHROMYCIN 250 MG PO TABS
ORAL_TABLET | ORAL | 0 refills | Status: AC
Start: 2023-07-12 — End: ?

## 2023-07-12 MED ORDER — TADALAFIL 10 MG PO TABS
10.0000 mg | ORAL_TABLET | ORAL | 5 refills | Status: AC | PRN
Start: 2023-07-12 — End: ?

## 2023-07-12 MED ORDER — PREDNISONE 10 MG PO TABS
ORAL_TABLET | ORAL | 0 refills | Status: AC
Start: 2023-07-12 — End: ?

## 2023-07-12 MED ORDER — OMEPRAZOLE 20 MG PO CPDR
20.0000 mg | DELAYED_RELEASE_CAPSULE | Freq: Every day | ORAL | Status: AC
Start: 2023-07-12 — End: ?

## 2023-07-12 NOTE — Progress Notes (Signed)
Subjective:    Patient ID: Dennis Keller, male    DOB: 10/25/1965, 58 y.o.   MRN: [id number]  Dennis Keller is a 58 y.o. male presenting on 07/12/2023 for Tinnitus (pulsatile)   HPI  Discussed the use of AI scribe software for clinical note transcription with the patient, who gave verbal consent to proceed.  History of Present Illness    Pulsatile Tinnitus / R Ear fullness Sinusitis chronic GERD post cholecystectomy Erectile Dysfunction  The patient, with a history of heartburn and sinus issues, presents with a new onset of pulsatile tinnitus in the right ear for the past three weeks. The symptom is described as a swooshing sound, synchronous with the heartbeat, and is more noticeable at night when lying down. The patient denies any associated ear pain or discharge.  In response to these symptoms, the patient resumed the use of Flonase, which is typically used only during the spring and summer months, and also started taking decongestants and antihistamines. However, these interventions have not alleviated the symptoms. The patient also reports a sensation of pressure in the sinuses, similar to the feeling experienced when underwater, but denies any nasal discharge or fever.  The patient has a history of tubes placed in the ears during childhood and reports some residual scar tissue. The patient also has a history of heartburn, which was previously managed with daily medication. However, the patient has been trying to manage the condition with over-the-counter medication recently. Despite this, the patient reports a recurrence of heartburn symptoms, although not as severe as before.  The patient also mentions the occasional use of Cialis for erectile dysfunction, which has been effective at the current dose. The patient has not required a refill for some time due to infrequent use.           07/12/2023   10:56 AM 11/25/2020    3:36 PM 12/29/2018    9:21 AM  Depression screen PHQ 2/9   Decreased Interest 0 0 0  Down, Depressed, Hopeless 0 0 0  PHQ - 2 Score 0 0 0  Altered sleeping  0   Tired, decreased energy  0   Change in appetite  0   Feeling bad or failure about yourself   0   Trouble concentrating  0   Moving slowly or fidgety/restless  0   Suicidal thoughts  0   PHQ-9 Score  0   Difficult doing work/chores  Not difficult at all        07/12/2023   10:56 AM 11/25/2020    3:36 PM  GAD 7 : Generalized Anxiety Score  Nervous, Anxious, on Edge 0 0  Control/stop worrying 0 0  Worry too much - different things 0 0  Trouble relaxing 0 0  Restless 0 0  Easily annoyed or irritable 0 0  Afraid - awful might happen 0 0  Total GAD 7 Score 0 0  Anxiety Difficulty Not difficult at all Not difficult at all    Social History   Tobacco Use   Smoking status: Never    Passive exposure: Never   Smokeless tobacco: Former    Types: Chew    Quit date: 2007   Tobacco comments:    Smokeless tobacco for 10 years, quit 2007  Vaping Use   Vaping status: Never Used  Substance Use Topics   Alcohol use: Never   Drug use: Never    Review of Systems Per HPI unless specifically indicated above  Objective:    BP 132/72   Pulse 77   Ht 5\' 9"  (1.753 m)   Wt 190 lb (86.2 kg)   SpO2 97%   BMI 28.06 kg/m   Wt Readings from Last 3 Encounters:  07/12/23 190 lb (86.2 kg)  01/08/23 179 lb (81.2 kg)  12/31/22 174 lb 2.6 oz (79 kg)    Physical Exam Vitals and nursing note reviewed.  Constitutional:      General: He is not in acute distress.    Appearance: He is well-developed. He is not diaphoretic.     Comments: Well-appearing, comfortable, cooperative  HENT:     Head: Normocephalic and atraumatic.     Right Ear: Ear canal and external ear normal. There is no impacted cerumen.     Left Ear: Ear canal and external ear normal. There is no impacted cerumen.     Ears:     Comments: Bilateral TM with some scar tissue hazy appearance, no erythema or bulging Eyes:      General:        Right eye: No discharge.        Left eye: No discharge.     Conjunctiva/sclera: Conjunctivae normal.  Neck:     Thyroid: No thyromegaly.     Vascular: No carotid bruit.  Cardiovascular:     Rate and Rhythm: Normal rate and regular rhythm.     Pulses: Normal pulses.     Heart sounds: Normal heart sounds. No murmur heard. Pulmonary:     Effort: Pulmonary effort is normal. No respiratory distress.     Breath sounds: Normal breath sounds. No wheezing or rales.  Musculoskeletal:        General: Normal range of motion.     Cervical back: Normal range of motion and neck supple.  Lymphadenopathy:     Cervical: No cervical adenopathy.  Skin:    General: Skin is warm and dry.     Findings: No erythema or rash.  Neurological:     Mental Status: He is alert and oriented to person, place, and time. Mental status is at baseline.  Psychiatric:        Behavior: Behavior normal.     Comments: Well groomed, good eye contact, normal speech and thoughts     Results for orders placed or performed during the hospital encounter of 12/18/22  Lipase, blood   Collection Time: 12/18/22  6:47 PM  Result Value Ref Range   Lipase 60 (H) 11 - 51 U/L  Comprehensive metabolic panel   Collection Time: 12/18/22  6:47 PM  Result Value Ref Range   Sodium 134 (L) 135 - 145 mmol/L   Potassium 3.5 3.5 - 5.1 mmol/L   Chloride 101 98 - 111 mmol/L   CO2 24 22 - 32 mmol/L   Glucose, Bld 89 70 - 99 mg/dL   BUN 18 6 - 20 mg/dL   Creatinine, Ser 1.19 0.61 - 1.24 mg/dL   Calcium 8.9 8.9 - 14.7 mg/dL   Total Protein 8.1 6.5 - 8.1 g/dL   Albumin 3.9 3.5 - 5.0 g/dL   AST 46 (H) 15 - 41 U/L   ALT 91 (H) 0 - 44 U/L   Alkaline Phosphatase 98 38 - 126 U/L   Total Bilirubin 0.6 0.3 - 1.2 mg/dL   GFR, Estimated >82 >95 mL/min   Anion gap 9 5 - 15  CBC   Collection Time: 12/18/22  6:47 PM  Result Value Ref Range   WBC 8.3  4.0 - 10.5 K/uL   RBC 4.99 4.22 - 5.81 MIL/uL   Hemoglobin 14.3 13.0 -  17.0 g/dL   HCT 54.0 98.1 - 19.1 %   MCV 88.2 80.0 - 100.0 fL   MCH 28.7 26.0 - 34.0 pg   MCHC 32.5 30.0 - 36.0 g/dL   RDW 47.8 29.5 - 62.1 %   Platelets 376 150 - 400 K/uL   nRBC 0.0 0.0 - 0.2 %  HIV Antibody (routine testing w rflx)   Collection Time: 12/19/22  6:15 AM  Result Value Ref Range   HIV Screen 4th Generation wRfx Non Reactive Non Reactive  Comprehensive metabolic panel   Collection Time: 12/20/22  5:31 AM  Result Value Ref Range   Sodium 137 135 - 145 mmol/L   Potassium 4.1 3.5 - 5.1 mmol/L   Chloride 106 98 - 111 mmol/L   CO2 26 22 - 32 mmol/L   Glucose, Bld 120 (H) 70 - 99 mg/dL   BUN 12 6 - 20 mg/dL   Creatinine, Ser 3.08 0.61 - 1.24 mg/dL   Calcium 8.5 (L) 8.9 - 10.3 mg/dL   Total Protein 6.3 (L) 6.5 - 8.1 g/dL   Albumin 3.1 (L) 3.5 - 5.0 g/dL   AST 92 (H) 15 - 41 U/L   ALT 154 (H) 0 - 44 U/L   Alkaline Phosphatase 77 38 - 126 U/L   Total Bilirubin 0.4 0.3 - 1.2 mg/dL   GFR, Estimated >65 >78 mL/min   Anion gap 5 5 - 15      Assessment & Plan:   Problem List Items Addressed This Visit     BPH (benign prostatic hyperplasia)   Relevant Medications   tadalafil (CIALIS) 10 MG tablet   Erectile dysfunction   Relevant Medications   tadalafil (CIALIS) 10 MG tablet   Gastroesophageal reflux disease without esophagitis   Relevant Medications   omeprazole (PRILOSEC) 20 MG capsule   Other Visit Diagnoses       Chronic rhinosinusitis    -  Primary   Relevant Medications   predniSONE (DELTASONE) 10 MG tablet   azithromycin (ZITHROMAX Z-PAK) 250 MG tablet     Tympanic membrane disorder, bilateral             Right Ear Discomfort / Pulsatile Tinnitus / Sinus Pressure Pulsatile tinnitus and pressure sensation, possibly related to sinus congestion. No signs of infection. Eardrum appears abnormal with signs of fluid and scar tissue. -Continue Flonase as currently doing. Continue Allergy meds, decongestant -Start Prednisone taper to reduce potential sinus  swelling. However no obvious sign of infection but we will also cover with Azithromycin Zpak if NOT improved he can try this before considering referral -If still unresolved, consider referral to ENT.  Gastroesophageal Reflux Disease Recurrence of symptoms after discontinuation of medication post-gallbladder surgery. -Try over-the-counter Omeprazole 20mg . -If effective, consider prescription for generic Omeprazole 20mg .  Erectile Dysfunction Continued management. -Continue Tadalafil (Cialis) 10mg  as needed, every 48 hours PRN -Use GoodRx for cost management.  Follow-up Plan for routine wellness checkup later in the year.         No orders of the defined types were placed in this encounter.   Meds ordered this encounter  Medications   predniSONE (DELTASONE) 10 MG tablet    Sig: Take 6 tabs with breakfast Day 1, 5 tabs Day 2, 4 tabs Day 3, 3 tabs Day 4, 2 tabs Day 5, 1 tab Day 6.    Dispense:  21 tablet  Refill:  0   omeprazole (PRILOSEC) 20 MG capsule    Sig: Take 1 capsule (20 mg total) by mouth daily.   azithromycin (ZITHROMAX Z-PAK) 250 MG tablet    Sig: Take 2 tabs (500mg  total) on Day 1. Take 1 tab (250mg ) daily for next 4 days.    Dispense:  6 tablet    Refill:  0   tadalafil (CIALIS) 10 MG tablet    Sig: Take 1 tablet (10 mg total) by mouth every other day as needed for erectile dysfunction.    Dispense:  30 tablet    Refill:  5    Follow up plan: Return if symptoms worsen or fail to improve.  Return later this year for Annual  [name], DO [facility] 07/12/2023, 11:22 AM

## 2023-07-12 NOTE — Patient Instructions (Addendum)
Thank you for coming to the office today.  For the Ears  Possible sinus pressure fluid / infection Try Steroid prednisone taper Continue other medications as you are  If not helping - Start Azithromycin Z pak (antibiotic) 2 tabs day 1, then 1 tab x 4 days, complete entire course even if improved  If stll not resolved, we can refer to ENT  ---- Try OTC Prilosec 20mg , if working well, let me know I can order the rx generic 20mg   Tadalafil Cialis printed, use good rx  Please schedule a Follow-up Appointment to: Return if symptoms worsen or fail to improve.  If you have any other questions or concerns, please feel free to call the office or send a message through MyChart. You may also schedule an earlier appointment if necessary.  Additionally, you may be receiving a survey about your experience at our office within a few days to 1 week by e-mail or mail. We value your feedback.  [name], DO [facility], [place]
